# Patient Record
Sex: Male | Born: 1975 | Hispanic: Yes | Marital: Married | Smoking: Never smoker
Health system: Southern US, Community
[De-identification: ages and names within clinical notes are randomized; demographics above are authoritative.]

## PROBLEM LIST (undated history)

## (undated) DIAGNOSIS — G4730 Sleep apnea, unspecified: Secondary | ICD-10-CM

## (undated) DIAGNOSIS — K219 Gastro-esophageal reflux disease without esophagitis: Secondary | ICD-10-CM

## (undated) HISTORY — DX: Morbid (severe) obesity due to excess calories: E66.01

## (undated) HISTORY — DX: Gastro-esophageal reflux disease without esophagitis: K21.9

---
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
## 1995-09-14 HISTORY — PX: ARTHROSCOPIC REPAIR ACL: SUR80

## 1999-11-21 ENCOUNTER — Emergency Department (HOSPITAL_COMMUNITY): Admission: EM | Admit: 1999-11-21 | Discharge: 1999-11-21 | Payer: Self-pay | Admitting: Emergency Medicine

## 1999-11-21 ENCOUNTER — Encounter: Payer: Self-pay | Admitting: Emergency Medicine

## 2008-11-24 ENCOUNTER — Emergency Department (HOSPITAL_COMMUNITY): Admission: EM | Admit: 2008-11-24 | Discharge: 2008-11-25 | Payer: Self-pay | Admitting: Emergency Medicine

## 2008-11-29 ENCOUNTER — Emergency Department (HOSPITAL_COMMUNITY): Admission: EM | Admit: 2008-11-29 | Discharge: 2008-11-30 | Payer: Self-pay | Admitting: Emergency Medicine

## 2008-12-01 ENCOUNTER — Emergency Department (HOSPITAL_COMMUNITY): Admission: EM | Admit: 2008-12-01 | Discharge: 2008-12-01 | Payer: Self-pay | Admitting: Emergency Medicine

## 2009-09-22 ENCOUNTER — Encounter (INDEPENDENT_AMBULATORY_CARE_PROVIDER_SITE_OTHER): Payer: Self-pay | Admitting: *Deleted

## 2009-09-30 ENCOUNTER — Ambulatory Visit: Payer: Self-pay | Admitting: Internal Medicine

## 2009-09-30 DIAGNOSIS — M722 Plantar fascial fibromatosis: Secondary | ICD-10-CM | POA: Insufficient documentation

## 2009-10-16 ENCOUNTER — Telehealth: Payer: Self-pay | Admitting: Internal Medicine

## 2009-12-01 ENCOUNTER — Ambulatory Visit: Payer: Self-pay | Admitting: Internal Medicine

## 2009-12-01 ENCOUNTER — Encounter (INDEPENDENT_AMBULATORY_CARE_PROVIDER_SITE_OTHER): Payer: Self-pay | Admitting: *Deleted

## 2009-12-02 ENCOUNTER — Telehealth (INDEPENDENT_AMBULATORY_CARE_PROVIDER_SITE_OTHER): Payer: Self-pay | Admitting: *Deleted

## 2009-12-04 ENCOUNTER — Encounter: Payer: Self-pay | Admitting: Internal Medicine

## 2009-12-05 LAB — CONVERTED CEMR LAB
Basophils Relative: 1.6 % (ref 0.0–3.0)
Eosinophils Relative: 3 % (ref 0.0–5.0)
HCT: 42.7 % (ref 39.0–52.0)
MCV: 88 fL (ref 78.0–100.0)
Monocytes Absolute: 0.6 10*3/uL (ref 0.1–1.0)
Neutrophils Relative %: 25.2 % — ABNORMAL LOW (ref 43.0–77.0)
RBC: 4.85 M/uL (ref 4.22–5.81)
WBC: 4.3 10*3/uL — ABNORMAL LOW (ref 4.5–10.5)

## 2010-10-01 ENCOUNTER — Other Ambulatory Visit: Payer: Self-pay | Admitting: Internal Medicine

## 2010-10-01 ENCOUNTER — Ambulatory Visit
Admission: RE | Admit: 2010-10-01 | Discharge: 2010-10-01 | Payer: Self-pay | Source: Home / Self Care | Attending: Internal Medicine | Admitting: Internal Medicine

## 2010-10-01 ENCOUNTER — Encounter: Payer: Self-pay | Admitting: Internal Medicine

## 2010-10-01 LAB — HEPATIC FUNCTION PANEL
ALT: 37 U/L (ref 0–53)
AST: 21 U/L (ref 0–37)
Albumin: 4.1 g/dL (ref 3.5–5.2)
Alkaline Phosphatase: 71 U/L (ref 39–117)
Bilirubin, Direct: 0.1 mg/dL (ref 0.0–0.3)
Total Bilirubin: 0.6 mg/dL (ref 0.3–1.2)
Total Protein: 7.2 g/dL (ref 6.0–8.3)

## 2010-10-01 LAB — CBC WITH DIFFERENTIAL/PLATELET
Basophils Absolute: 0 10*3/uL (ref 0.0–0.1)
Basophils Relative: 0.3 % (ref 0.0–3.0)
Eosinophils Absolute: 0.2 10*3/uL (ref 0.0–0.7)
Eosinophils Relative: 1.6 % (ref 0.0–5.0)
HCT: 42.8 % (ref 39.0–52.0)
Hemoglobin: 14.3 g/dL (ref 13.0–17.0)
Lymphocytes Relative: 28.3 % (ref 12.0–46.0)
Lymphs Abs: 2.6 10*3/uL (ref 0.7–4.0)
MCHC: 33.3 g/dL (ref 30.0–36.0)
MCV: 85.3 fl (ref 78.0–100.0)
Monocytes Absolute: 0.6 10*3/uL (ref 0.1–1.0)
Monocytes Relative: 6.2 % (ref 3.0–12.0)
Neutro Abs: 5.8 10*3/uL (ref 1.4–7.7)
Neutrophils Relative %: 63.6 % (ref 43.0–77.0)
Platelets: 214 10*3/uL (ref 150.0–400.0)
RBC: 5.01 Mil/uL (ref 4.22–5.81)
RDW: 14.9 % — ABNORMAL HIGH (ref 11.5–14.6)
WBC: 9.2 10*3/uL (ref 4.5–10.5)

## 2010-10-01 LAB — BASIC METABOLIC PANEL
BUN: 22 mg/dL (ref 6–23)
CO2: 27 mEq/L (ref 19–32)
Calcium: 8.8 mg/dL (ref 8.4–10.5)
Chloride: 104 mEq/L (ref 96–112)
Creatinine, Ser: 1 mg/dL (ref 0.4–1.5)
GFR: 94.27 mL/min (ref >60.00)
Glucose, Bld: 85 mg/dL (ref 70–99)
Potassium: 4 mEq/L (ref 3.5–5.1)
Sodium: 138 mEq/L (ref 135–145)

## 2010-10-01 LAB — LIPID PANEL
Cholesterol: 178 mg/dL (ref 0–200)
HDL: 45.2 mg/dL (ref >39.00)
LDL Cholesterol: 116 mg/dL — ABNORMAL HIGH (ref 0–99)
Total CHOL/HDL Ratio: 4
Triglycerides: 86 mg/dL (ref 0.0–149.0)
VLDL: 17.2 mg/dL (ref 0.0–40.0)

## 2010-10-01 LAB — TSH: TSH: 1.86 u[IU]/mL (ref 0.35–5.50)

## 2010-10-13 NOTE — Consult Note (Signed)
Summary: [facility]   Imported By: Lanelle Bal 12/12/2009 10:47:27  _____________________________________________________________________  External Attachment:    Type:   Image     Comment:   External Document

## 2010-10-13 NOTE — Assessment & Plan Note (Signed)
Summary: NEW PT/BCBS/NS/KDC   Vital Signs:  Patient profile:   35 year old male Height:      69 inches Weight:      317 pounds BMI:     46.98 Temp:     98.3 degrees F oral Pulse rate:   80 / minute Resp:     16 per minute BP sitting:   122 / 86  (left arm) Cuff size:   large  Vitals Entered By: Shonna Chock (September 30, 2009 3:40 PM) CC: New Patient Establish: ongoing cough for about 1 month, nasel congestion, and  drainage down throat.  Comments No current meds   CC:  New Patient Establish: ongoing cough for about 1 month, nasel congestion, and and  drainage down throat. Marland Kitchen  History of Present Illness: Sergio Cline is here to establish as a new patient; he has several concerns. He has had a persistant NP cough since 09/06/2009 in context of throat phlegm. Nasal congestion in am with mucus & blood. Rx: Tylenol  Flu Syrup & Alka Seltzer. Also he has  non pruritic pigmentation on feet for > 25month .Sole pain upon arisng in am.  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 Eyes:  Denies blurring, double vision, and vision loss-both eyes. ENT:  Denies difficulty swallowing, ear discharge, earache, and hoarseness; No frontal headache or facial pain. CV:  Denies chest pain or discomfort, palpitations, shortness of breath with exertion, swelling of feet, and swelling of hands. Resp:  Denies  chest pain with inspiration, coughing up blood, pleuritic, shortness of breath, and wheezing; No PMH of asthma. GI:  Denies abdominal pain, bloody stools, dark tarry stools, and indigestion; Dyspepsia better off coffee. GU:  Denies discharge, dysuria, and hematuria. MS:  Denies joint redness, joint swelling, low back pain, mid back pain, and thoracic pain. Derm:  Complains of changes in color of skin; denies changes in nail beds, dryness, and hair loss. Neuro:  Complains of numbness; denies tingling; Numbness in hands after video games & with guitar. Psych:  Denies anxiety and depression. Endo:  Denies cold intolerance, excessive hunger, excessive thirst, excessive urination, and heat intolerance. Heme:  Denies abnormal bruising and bleeding. Allergy:  Denies itching eyes, seasonal allergies, and sneezing.  Physical Exam  General:  well-nourished,in no acute distress; alert,appropriate and cooperative throughout examination;overweight-appearing.   Head:  Normocephalic and atraumatic without obvious abnormalities. No apparent alopecia  Eyes:  No corneal or conjunctival inflammation noted. Perrla. Funduscopic exam benign, without hemorrhages, exudates or papilledema.  Ears:  External ear exam shows no significant lesions or deformities.  Otoscopic examination reveals clear canals, tympanic membranes are intact bilaterally without bulging, retraction, inflammation or discharge. Hearing is grossly normal bilaterally. Nose:  External nasal examination shows no deformity or inflammation. Nasal mucosa are  dry without lesions or exudates. Mouth:  Oral mucosa and oropharynx without lesions or exudates.  Teeth in good repair. Neck:  No deformities, masses, or tenderness noted. Lungs:  Normal respiratory effort, chest expands  symmetrically. Lungs are clear to auscultation, no crackles or wheezes. Heart:  Normal rate and regular rhythm. S1 and S2 normal without gallop, murmur, click, rub . Abdomen:  Bowel  sounds positive,abdomen soft and non-tender without masses, organomegaly or hernias noted. Rectal:  Deferred Genitalia:  Testes bilaterally descended without nodularity, tenderness or masses. No scrotal masses or lesions. No penis lesions or urethral discharge. Prostate:  Deferred due to age Msk:  No deformity or scoliosis noted of thoracic or lumbar spine.   Pulses:  R and L carotid,radial,dorsalis pedis and posterior tibial pulses are full and equal bilaterally Extremities:  No clubbing, cyanosis, edema, or deformity noted with normal full range of motion of all joints.  Pes planus   Neurologic:  alert & oriented X3 and DTRs symmetrical and normal.   Skin:  Minor statsis dermatitis over feet Cervical Nodes:  No lymphadenopathy noted Axillary Nodes:  No palpable lymphadenopathy Psych:  memory intact for recent and remote, normally interactive, and good eye contact.     Impression & Recommendations:  Problem # 1:  ROUTINE GENERAL MEDICAL EXAM@HEALTH  CARE FACL (ICD-V70.0)  Problem # 2:  URI (ICD-465.9)  Problem # 3:  PLANTAR FASCIITIS, BILATERAL (ICD-728.71)  Problem # 4:  STASIS DERMATITIS (ICD-454.1)  Problem # 5:  COUGH (ICD-786.2) from #2  Patient Instructions: 1)  Please schedule a follow-up appointment in 3 months. 2)  BMP prior to visit, ICD-9: 3)  Hepatic Panel prior to visit, ICD-9: 4)  Lipid Panel prior to visit, ICD-9: 5)  TSH prior to visit, ICD-9: 6)  CBC w/ Diff prior to visit, ICD-9: 7)  HbgA1C prior to visit, ICD-9: Consume  LESS THAN 40 grams of sugar/ day from LABELED foods & drinks with High Fructose Corn Syrup as #1, 2 or 33 on label. Arch supports for  shoes. Neti pot once daily until congestion has resolved . Euceri two times a day to dry nasal tissue

## 2010-10-13 NOTE — Letter (Signed)
Summary: Work Dietitian at [facility]  [address] [place], [place] [address]   Phone: [phone]  Fax: [phone]    Today's Date: December 01, 2009  Name of Patient: Sergio Cline  The above named patient had a medical visit today at:  11:45am / pm.  Please take this into consideration when reviewing the time away from work/school.    Special Instructions:  Arly.Keller ] None  [  ] To be off the remainder of today, returning to the normal work / school schedule tomorrow.  [  ] To be off until the next scheduled appointment on ______________________.  [  ] Other ________________________________________________________________ ________________________________________________________________________   Sincerely yours,   Shonna Chock

## 2010-10-13 NOTE — Letter (Signed)
Summary: New Patient Letter  [facility]  [address] [place], [place] [address]   Phone: [phone]  Fax: [phone]       09/22/2009 MRN: [id number]  Sergio Cline [address] [place], [place]  [address]  Dear [name],   Welcome to [facility] and thank you for choosing [place] as your Primary Care Providers. Enclosed you will find information about our practice that we hope you find helpful. We have also enclosed forms to be filled out prior to your visit. This will provide [place] with the necessary information and facilitate your being seen in a timely manner. If you have any questions, please call us at:  [phone]        and we will be happy to assist you. We look forward to seeing you at your scheduled appointment time.  Appointment   [name] [phone]            with Dr.  Alwyn Ren               Sincerely,  Primary Health Care Team  Please arrive 15 minutes early for your first appointment and bring your insurance card. Co-pay is required at the time of your visit.  *****Please call the office if you are not able to keep this appointment. There is a charge of $50.00 if any appointment is not cancelled or rescheduled within 24 hours.

## 2010-10-13 NOTE — Progress Notes (Signed)
Summary: yellow mucous now  Phone Note Call from Patient Call back at Home Phone [phone]   Caller: Patient Summary of Call: pt was seen on 09-30-09 for cough and nasal congestion and was told to call back if mucous changed from clear. pt states that now the nasal mucous has turn a bright yellow. pt still c/o nasal  drainage, congestion and some throat irritation. pt denies any fever, and cough. pt uses  [facility] [address].......Marland KitchenFelecia Deloach CMA  October 16, 2009 12:03 PM   Follow-up for Phone Call        Neti pot once daily as needed for congestion. Amox 500 mg three times a day #30. Follow-up by: Marga Melnick MD,  October 16, 2009 12:24 PM  Additional Follow-up for Phone Call Additional follow up Details #1::        pt aware, rx sent to pharmacy............Marland KitchenFelecia Deloach CMA  October 16, 2009 12:50 PM     New/Updated Medications: AMOXICILLIN 500 MG CAPS (AMOXICILLIN) Take three times a day Prescriptions: AMOXICILLIN 500 MG CAPS (AMOXICILLIN) Take three times a day  #30 x 0   Entered by:   Jeremy Johann CMA   Authorized by:   Marga Melnick MD   Signed by:   Jeremy Johann CMA on 10/16/2009   Method used:   Faxed to ...       [facility] [address] (retail)       [phone]       [place], [place]  [address]       Ph: [phone]       Fax: [phone]   RxID:   [id number]

## 2010-10-13 NOTE — Progress Notes (Signed)
----   Converted from flag ---- ---- 12/02/2009 9:54 AM, Harold Barban wrote: Patient called in and said the pharmacy never recieved anything about the antibiotic, just got a fax about the cream. Can you re-fax the rx for the antibiotic? Thanks! ------------------------------  Phone Note Call from Patient   Summary of Call: Rx was re-faxed./[facility]  December 02, 2009 10:05 AM     Prescriptions: AMOXICILLIN-POT CLAVULANATE 500-125 MG TABS (AMOXICILLIN-POT CLAVULANATE) 1 every 12 hrs with a meal  #20 x 0   Entered by:   Shonna Chock   Authorized by:   Marga Melnick MD   Signed by:   Shonna Chock on 12/02/2009   Method used:   Electronically to        [facility] [address] (retail)       [phone]       [place], [place]  [address]       Ph: [phone]       Fax: [phone]   RxID:   [id number]

## 2010-10-13 NOTE — Assessment & Plan Note (Signed)
Summary: infection, starting w/ingrown toenail//lch   Vital Signs:  Patient profile:   35 year old male Weight:      311.6 pounds Temp:     98.4 degrees F oral Pulse rate:   60 / minute Resp:     15 per minute BP sitting:   118 / 82  (left arm) Cuff size:   large  Vitals Entered By: Shonna Chock (December 01, 2009 11:58 AM) CC: Feet Concerns-left is the worse, ? infected Comments REVIEWED MED LIST, PATIENT AGREED DOSE AND INSTRUCTION CORRECT    CC:  Feet Concerns-left is the worse and ? infected.  History of Present Illness: Recurrent "infections" @ medial aspect of great nails over past 5 yrs.No fever , chills, sweats or pus. L great toe red since 11/28/2009 & spreading. pain with ambulation. Rx: trimming, Advil.No Diabetes  Allergies (verified): No Known Drug Allergies  Review of Systems General:  Denies chills, fever, and sweats. Derm:  Complains of changes in color of skin and poor wound healing. Endo:  Denies excessive hunger, excessive thirst, and excessive urination.  Physical Exam  General:  in no acute distress; alert,appropriate and cooperative throughout examination Pulses:  R and L dorsalis pedis and posterior tibial pulses are full and equal bilaterally Skin:  Active paronychia L great toenail medially with erythema; no purulence   Impression & Recommendations:  Problem # 1:  PARONYCHIA, LEFT GREAT TOE (ICD-681.11)  Lesser changes R great nail also The following medications were removed from the medication list:    Amoxicillin 500 Mg Caps (Amoxicillin) .Marland Kitchen... Take three times a day His updated medication list for this problem includes:    Amoxicillin-pot Clavulanate 500-125 Mg Tabs (Amoxicillin-pot clavulanate) .Marland Kitchen... 1 every 12 hrs with a meal  Orders: Podiatry Referral (Podiatry) Venipuncture (32202) TLB-CBC Platelet - w/Differential (85025-CBCD) TLB-A1C / Hgb A1C (Glycohemoglobin) (83036-A1C)  Complete Medication List: 1)  Amoxicillin-pot Clavulanate  500-125 Mg Tabs (Amoxicillin-pot clavulanate) .Marland Kitchen.. 1 every 12 hrs with a meal  Patient Instructions: 1)  Nizoral cream two times a day to nail bed & blow dry with a hairdrier Prescriptions: AMOXICILLIN-POT CLAVULANATE 500-125 MG TABS (AMOXICILLIN-POT CLAVULANATE) 1 every 12 hrs with a meal  #20 x 0   Entered and Authorized by:   Marga Melnick MD   Signed by:   Marga Melnick MD on 12/01/2009   Method used:   Faxed to ...       [facility] [address] (retail)       [phone]       [place], [place]  [address]       Ph: [phone]       Fax: [phone]   RxID:   [id number]   Appended Document: infection, starting w/ingrown toenail//lch

## 2010-10-15 NOTE — Assessment & Plan Note (Signed)
Summary: cpx & lab/cbs   Vital Signs:  Patient profile:   35 year old male Height:      68 inches Weight:      324 pounds BMI:     49.44 Temp:     97.4 degrees F oral Pulse rate:   76 / minute Resp:     14 per minute BP sitting:   122 / 80  (left arm) Cuff size:   large  Vitals Entered By: Shonna Chock CMA (October 01, 2010 9:40 AM) CC: CPX with fasting labs  Comments Patient states TDaP Up- to- date (5 years ago)   CC:  CPX with fasting labs .  History of Present Illness:    Sergio Cline is here for a physical; he wishes to pursue Lap Band Surgery . He has been working with  a Nutritionist for 8 months; he has lost up to 16# ,but he has regained it.  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 Eyes:  Denies blurring, double vision, and vision loss-both eyes. ENT:  Denies difficulty swallowing and hoarseness. CV:  Denies chest pain or discomfort, difficulty breathing at night, difficulty breathing while lying down, leg cramps with exertion, palpitations, and swelling of hands; Edema post  prolonged driving. Resp:  Complains of excessive snoring; denies cough, hypersomnolence, morning headaches, shortness of breath, sputum productive, and wheezing. GI:  Denies abdominal pain, bloody stools, constipation, dark tarry stools, diarrhea, and indigestion. GU:  Denies discharge, dysuria, and hematuria. MS:  Denies joint pain, low back pain, mid back pain, and thoracic pain; pain  in arches; seeing Dr Amanda Pea for R elbow tendosynovitis. Derm:  Denies changes in nail beds, dryness, hair loss, lesion(s), and rash. Neuro:  Denies tingling; Intermittent numbness L hand. Psych:  Denies anxiety and depression. Endo:  Denies cold intolerance, excessive hunger, excessive thirst, excessive urination, and heat intolerance. Heme:  Denies abnormal bruising and bleeding. Allergy:  Denies hives or rash and sneezing.  Physical Exam  General:  in no acute distress; alert,appropriate and cooperative throughout examination;obesity Head:  Normocephalic and atraumatic without obvious abnormalities. No apparent alopecia or balding. Eyes:  No corneal or conjunctival inflammation noted. EOMI. Perrla. Funduscopic exam benign, without hemorrhages, exudates or papilledema.No lid lag  Ears:  External ear exam shows no significant lesions or deformities.  Otoscopic examination reveals clear canals, tympanic membranes are intact bilaterally without bulging, retraction, inflammation or discharge. Hearing is grossly normal bilaterally. Nose:  External nasal examination shows no deformity or inflammation. Nasal mucosa are pink and moist without lesions or exudates. Mouth:  Oral mucosa and oropharynx without lesions or exudates.  Teeth in good repair. Oropharynx crowded Neck:  No deformities, masses, or tenderness noted. Breasts:  gynecomastia.   Lungs:  Normal respiratory effort, chest expands symmetrically. Lungs are clear to auscultation, no crackles or wheezes. Heart:  Normal rate and regular rhythm. S1 and S2 normal without gallop, murmur, click, rub .S4 Abdomen:  Bowel sounds positive,abdomen soft and non-tender without masses, organomegaly or hernias noted. Massive abdomen with panniculus Genitalia:  Testes bilaterally descended without nodularity, tenderness or masses. No scrotal masses or lesions. No penis lesions or urethral discharge. Prostate:  Habitus precludes  exam Msk:  No deformity or  scoliosis noted of thoracic or lumbar spine.   Pulses:  R and L carotid,radial,dorsalis pedis and posterior tibial pulses are full and equal bilaterally Extremities:  No clubbing, cyanosis, edema, or deformity noted with normal full range of motion of all joints.   Crepitus of knees. + Tinel's , L >R. Classic R "tennis elbow" Neurologic:  alert & oriented X3 and DTRs symmetrical and normal.   Skin:  Intact without suspicious lesions or rashes Cervical Nodes:  No lymphadenopathy noted Axillary Nodes:  No palpable lymphadenopathy Inguinal Nodes:  No significant adenopathy Psych:  memory intact for recent and remote, normally interactive, and good eye contact.     Impression & Recommendations:  Problem # 1:  ROUTINE GENERAL MEDICAL EXAM@HEALTH  CARE FACL (ICD-V70.0)  Orders: EKG w/ Interpretation ([address]) Venipuncture ([address]) TLB-Lipid Panel (80061-LIPID) TLB-BMP (Basic Metabolic Panel-BMET) (80048-METABOL) TLB-CBC Platelet - w/Differential (85025-CBCD) TLB-Hepatic/Liver Function Pnl (80076-HEPATIC) TLB-TSH (Thyroid Stimulating Hormone) (84443-TSH) Misc. Referral (Misc. Ref)  Problem # 2:  MORBID OBESITY (ICD-278.01)  Orders: Misc. Referral (Misc. Ref)  Problem # 3:  PLANTAR FASCIITIS, BILATERAL (ICD-728.71) aggravated by weight  Patient Instructions: 1)  Calculate grams of sugar / day from foods & drinks with High Fructose Corn Syrup as #2,3 or # 4 on label. Exercises  for Tennis Elbow as described   Orders Added: 1)  Est. Patient 18-39 years [99395] 2)  EKG w/ Interpretation [93000] 3)  Venipuncture [36415] 4)  TLB-Lipid Panel [80061-LIPID] 5)  TLB-BMP (Basic Metabolic Panel-BMET) [80048-METABOL] 6)  TLB-CBC Platelet - w/Differential [85025-CBCD] 7)  TLB-Hepatic/Liver Function Pnl [80076-HEPATIC] 8)  TLB-TSH (Thyroid Stimulating Hormone) [84443-TSH] 9)  Misc. Referral [Misc. Ref]   Immunization History:  Tetanus/Td Immunization History:    Tetanus/Td:  historical  (09/13/2005)   Immunization History:  Tetanus/Td Immunization History:    Tetanus/Td:  Historical (09/13/2005)    Appended Document: cpx & lab/cbs

## 2010-11-13 ENCOUNTER — Encounter: Payer: Self-pay | Admitting: Internal Medicine

## 2010-11-13 ENCOUNTER — Other Ambulatory Visit: Payer: Self-pay | Admitting: Internal Medicine

## 2010-11-13 ENCOUNTER — Ambulatory Visit (INDEPENDENT_AMBULATORY_CARE_PROVIDER_SITE_OTHER): Payer: BC Managed Care – PPO | Admitting: Internal Medicine

## 2010-11-13 DIAGNOSIS — R042 Hemoptysis: Secondary | ICD-10-CM

## 2010-11-13 DIAGNOSIS — R071 Chest pain on breathing: Secondary | ICD-10-CM | POA: Insufficient documentation

## 2010-11-13 DIAGNOSIS — J209 Acute bronchitis, unspecified: Secondary | ICD-10-CM

## 2010-11-13 LAB — CBC WITH DIFFERENTIAL/PLATELET
Basophils Relative: 0.4 % (ref 0.0–3.0)
Eosinophils Relative: 3 % (ref 0.0–5.0)
HCT: 43.2 % (ref 39.0–52.0)
Lymphs Abs: 3.3 10*3/uL (ref 0.7–4.0)
MCV: 84.1 fl (ref 78.0–100.0)
Monocytes Absolute: 0.8 10*3/uL (ref 0.1–1.0)
Neutro Abs: 6.8 10*3/uL (ref 1.4–7.7)
Platelets: 236 10*3/uL (ref 150.0–400.0)
WBC: 11.3 10*3/uL — ABNORMAL HIGH (ref 4.5–10.5)

## 2010-11-18 ENCOUNTER — Ambulatory Visit (INDEPENDENT_AMBULATORY_CARE_PROVIDER_SITE_OTHER)
Admission: RE | Admit: 2010-11-18 | Discharge: 2010-11-18 | Disposition: A | Discharge status: Home / Self Care | Payer: BC Managed Care – PPO | Source: Ambulatory Visit | Attending: Internal Medicine | Admitting: Internal Medicine

## 2010-11-18 ENCOUNTER — Other Ambulatory Visit: Payer: Self-pay | Admitting: Internal Medicine

## 2010-11-18 DIAGNOSIS — R071 Chest pain on breathing: Secondary | ICD-10-CM

## 2010-11-18 DIAGNOSIS — R042 Hemoptysis: Secondary | ICD-10-CM

## 2010-11-18 DIAGNOSIS — J209 Acute bronchitis, unspecified: Secondary | ICD-10-CM

## 2010-11-19 NOTE — Assessment & Plan Note (Signed)
Summary: cough and congestion /cdj   Vital Signs:  Patient profile:   35 year old male Weight:      321.6 pounds BMI:     49.08 O2 Sat:      93 % on Room air Temp:     98.1 degrees F oral Pulse rate:   80 / minute Resp:     15 per minute BP sitting:   118 / 88  (left arm) Cuff size:   large  Vitals Entered By: Shonna Chock CMA (November 13, 2010 11:31 AM)  O2 Flow:  Room air CC: Cough and congestion x 2 weeks. Now when patient cough's he has a stabbing pain on his right side   CC:  Cough and congestion x 2 weeks. Now when patient cough's he has a stabbing pain on his right side.  History of Present Illness:    Onset of sharp R inferior pleuritic chest pain as of last night. "Cold" 2 weeks ago was associated with cough productive of yellow sputum  with streaks of blood until 1 week ago. The streaky hemoptysis resolved with humidier use. He now  reports productive cough with green sputum  and upper airway wheezing with forced expiration  , but denies shortness of breath, fever, hemoptysis, and malaise.  The patient denies the following symptoms: cold/URI symptoms, sore throat, nasal congestion, and acid reflux symptoms.  Partially effective prior treatments have included  Hall's throat lozenges, Mucinex & Robitussin.  He denies risk factors of allergic rhinitis,  asthma, and  reflux.    Allergies (verified): No Known Drug Allergies  Physical Exam  General:  in no acute distress; alert,appropriate and cooperative throughout examination Ears:  External ear exam shows no significant lesions or deformities.  Otoscopic examination reveals clear canals, tympanic membranes are intact bilaterally without bulging, retraction, inflammation or discharge. Hearing is grossly normal bilaterally. Nose:  External nasal examination shows no deformity or inflammation. Nasal mucosa are  dry without lesions or exudates. Mouth:  Oral mucosa and oropharynx without lesions or exudates.  Teeth in good repair.  Oropharyngeal  crowding  Chest Wall:  no tenderness to palpation   Lungs:  Normal respiratory effort, chest expands symmetrically. Lungs are clear to auscultation, no crackles or wheezes. Heart:  Normal rate and regular rhythm. S1 and S2 normal without gallop, murmur, click, rub.S4 Abdomen:  Bowel sounds positive,abdomen soft and non-tender without masses, organomegaly or hernias noted. Extremities:  trace left  & R pedal edema.  Homan' s negative Cervical Nodes:  No lymphadenopathy noted Axillary Nodes:  No palpable lymphadenopathy Psych:  memory intact for recent and remote, normally interactive, and good eye contact.     Impression & Recommendations:  Problem # 1:  CHEST PAIN, PLEURITIC (ICD-786.52)  Orders: T-2 View CXR (71020TC) Venipuncture ([address]) TLB-CBC Platelet - w/Differential (85025-CBCD) T-D-Dimer Fibrin Derivatives Quantitive ([id number])  Problem # 2:  BRONCHITIS-ACUTE (ICD-466.0)  Orders: T-2 View CXR (71020TC) Venipuncture (91478) TLB-CBC Platelet - w/Differential (85025-CBCD)  His updated medication list for this problem includes:    Hydromet 5-1.5 Mg/53ml Syrp (Hydrocodone-homatropine) .Marland Kitchen... 1 tsp every 6 hrs as needed for cough    Azithromycin 250 Mg Tabs (Azithromycin) .Marland Kitchen... As per pack  Problem # 3:  HEMOPTYSIS UNSPECIFIED (ICD-786.30)  Orders: T-2 View CXR (71020TC) Venipuncture (29562) TLB-CBC Platelet - w/Differential (85025-CBCD) T-D-Dimer Fibrin Derivatives Quantitive ([id number])  Complete Medication List: 1)  Hydromet 5-1.5 Mg/42ml Syrp (Hydrocodone-homatropine) .Marland Kitchen.. 1 tsp every 6 hrs as needed for cough 2)  Azithromycin 250  Mg Tabs (Azithromycin) .... As per pack  Patient Instructions: 1)  Aleve 1-2 pills every 8 hrs as needed with food for the pleurisy. 2)  Drink as much NON dairy  fluid as you can tolerate for the next few days. Prescriptions: AZITHROMYCIN 250 MG TABS (AZITHROMYCIN) as per pack  #1 x 0   Entered and Authorized by:    Marga Melnick MD   Signed by:   Marga Melnick MD on 11/13/2010   Method used:   Print then Give to Patient   RxID:   [id number] HYDROMET 5-1.5 MG/5ML SYRP (HYDROCODONE-HOMATROPINE) 1 tsp every 6 hrs as needed for cough  #120cc x 0   Entered and Authorized by:   Marga Melnick MD   Signed by:   Marga Melnick MD on 11/13/2010   Method used:   Print then Give to Patient   RxID:   [id number]    Orders Added: 1)  T-2 View CXR [71020TC] 2)  Est. Patient Level IV [84696] 3)  Venipuncture [29528] 4)  TLB-CBC Platelet - w/Differential [85025-CBCD] 5)  T-D-Dimer Fibrin Derivatives Quantitive [[id number]]

## 2010-11-25 ENCOUNTER — Ambulatory Visit (INDEPENDENT_AMBULATORY_CARE_PROVIDER_SITE_OTHER): Payer: BC Managed Care – PPO | Admitting: Internal Medicine

## 2010-11-25 ENCOUNTER — Encounter: Payer: Self-pay | Admitting: Internal Medicine

## 2010-11-25 DIAGNOSIS — J069 Acute upper respiratory infection, unspecified: Secondary | ICD-10-CM | POA: Insufficient documentation

## 2010-11-25 DIAGNOSIS — J029 Acute pharyngitis, unspecified: Secondary | ICD-10-CM

## 2010-11-25 LAB — CONVERTED CEMR LAB: Rapid Strep: NEGATIVE

## 2010-12-01 NOTE — Assessment & Plan Note (Signed)
Summary: walkin in-sore throat-going out of country for 2 weeks/kn   Vital Signs:  Patient profile:   35 year old male Weight:      323.6 pounds BMI:     49.38 Temp:     98.5 degrees F oral Pulse rate:   84 / minute Resp:     14 per minute BP sitting:   136 / 88  (left arm) Cuff size:   large  Vitals Entered By: Shonna Chock CMA (November 25, 2010 5:05 PM) CC: Sore throat and pressure in ears (right ear is worse) , URI symptoms   CC:  Sore throat and pressure in ears (right ear is worse)  and URI symptoms.  History of Present Illness:    Onset as sneezing 11/24/2010 ; ST today. He  reports  light purulent nasal discharge and dry cough, but denies nasal congestion and earache.  The patient denies dyspnea, wheezing, vomiting, and diarrhea.  The patient also reports frontal  headache & tinnitus .  The patient denies the following risk factors for Strep sinusitis: unilateral facial pain, tooth pain, and tender adenopathy.  Rx: saline gargles, OTC drops, Theraflu. He did not take Flu shot. He basically here as leaving for [place] in am for 2 weeks.  Current Medications (verified): 1)  None  Allergies (verified): No Known Drug Allergies  Physical Exam  General:  in no acute distress; alert,appropriate and cooperative throughout examination Eyes:  No corneal or conjunctival inflammation noted. EOMI. Perrla. Vision grossly normal. Ears:  External ear exam shows no significant lesions or deformities.  Otoscopic examination reveals clear canals, tympanic membranes are intact bilaterally without bulging, retraction, inflammation or discharge. Hearing is grossly normal bilaterally. Nose:  External nasal examination shows no deformity or inflammation. Nasal mucosa are pink and moist without lesions or exudates. Mouth:  Oral mucosa and oropharynx without lesions or exudates.  Teeth in good repair. Crowding of pharynx Lungs:  Normal respiratory effort, chest expands symmetrically. Lungs are clear to  auscultation, no crackles or wheezes. Heart:  Normal rate and regular rhythm. S1 and S2 normal without gallop, murmur, click, rub or other extra sounds. Cervical Nodes:  No lymphadenopathy noted Axillary Nodes:  No palpable lymphadenopathy   Impression & Recommendations:  Problem # 1:  PHARYNGITIS-ACUTE (ICD-462)  The following medications were removed from the medication list:    Azithromycin 250 Mg Tabs (Azithromycin) .Marland Kitchen... As per pack His updated medication list for this problem includes:    Amoxicillin 500 Mg Caps (Amoxicillin) .Marland Kitchen... 1 three times a day  Problem # 2:  URI (ICD-465.9)  The following medications were removed from the medication list:    Hydromet 5-1.5 Mg/58ml Syrp (Hydrocodone-homatropine) .Marland Kitchen... 1 tsp every 6 hrs as needed for cough  Complete Medication List: 1)  Amoxicillin 500 Mg Caps (Amoxicillin) .Marland Kitchen.. 1 three times a day  Other Orders: Rapid Strep (54098)  Patient Instructions: 1)  [place] OR Zicam Melts + vitamin C 2000 mg once daily + Echinacea for 5-7 days. Take Rx if "pain , pus & fever " appear in next 5 days. 2)  Drink as much fluid as you can tolerate for the next few days. Prescriptions: AMOXICILLIN 500 MG CAPS (AMOXICILLIN) 1 three times a day  #30 x 0   Entered and Authorized by:   Marga Melnick MD   Signed by:   Marga Melnick MD on 11/25/2010   Method used:   Electronically to        [facility]  [address] (retail)       [address] [place], [place]  [address]       Ph: [phone]       Fax: [phone]   RxID:   [id number]    Orders Added: 1)  Rapid Strep [93818] 2)  Est. Patient Level III [29937]    Laboratory Results    Other Tests  Rapid Strep: negative

## 2010-12-14 ENCOUNTER — Encounter: Payer: Self-pay | Admitting: Family Medicine

## 2010-12-14 ENCOUNTER — Ambulatory Visit (INDEPENDENT_AMBULATORY_CARE_PROVIDER_SITE_OTHER): Payer: BC Managed Care – PPO | Admitting: Family Medicine

## 2010-12-14 VITALS — BP 120/78 | Temp 99.0°F | Ht 68.0 in | Wt 323.0 lb

## 2010-12-14 DIAGNOSIS — R0982 Postnasal drip: Secondary | ICD-10-CM

## 2010-12-14 MED ORDER — FLUTICASONE PROPIONATE 50 MCG/ACT NA SUSP: 2.0000 | Freq: Every day | NASAL | Status: DC

## 2010-12-14 MED ORDER — MAGIC MOUTHWASH W/LIDOCAINE: 5.0000 mL | Freq: Three times a day (TID) | ORAL | Status: DC

## 2010-12-14 NOTE — Progress Notes (Signed)
  Subjective:    Patient ID: Sergio Cline, male    DOB: October 07, 1975, 35 y.o.   MRN: [id number]  HPI Sore throat- has seen Dr Alwyn Ren 3x in 6 weeks.  Took abx 2 weeks ago.  Reports daily sore throats.  Using OTC meds w/out relief.  + PND, pain w/ swallowing.  sxs have been present for 6 weeks- alternating between sore throat and coughing.  Denies fevers.  No known sick contacts.  sxs are worse at night and first thing in the AM.  Denies hx of seasonal allergies.     Review of Systems For ROS see HPI     Objective:   Physical Exam  Constitutional: He appears well-developed and well-nourished. No distress.  HENT:  Head: Normocephalic and atraumatic.  Right Ear: Tympanic membrane normal.  Left Ear: Tympanic membrane normal.  Nose: Mucosal edema and rhinorrhea present. Right sinus exhibits no maxillary sinus tenderness and no frontal sinus tenderness. Left sinus exhibits no maxillary sinus tenderness and no frontal sinus tenderness.  Mouth/Throat: Posterior oropharyngeal erythema present. No oropharyngeal exudate or posterior oropharyngeal edema.       + PND  Neck: Normal range of motion. Neck supple.  Cardiovascular: Normal rate, regular rhythm and normal heart sounds.   Pulmonary/Chest: Effort normal and breath sounds normal. No respiratory distress. He has no wheezes.  Lymphadenopathy:    He has no cervical adenopathy.          Assessment & Plan:

## 2010-12-14 NOTE — Patient Instructions (Signed)
Your sore throat appears to be due to your post nasal drip Start the nasal spray as directed to decrease the congestion and post nasal drip Increase your fluid intake STOP taking the Aleve Start 3 ibuprofen 3x/day- take w/ food Use the Magic Mouthwash 3x/day- you can either swish and spit or swish and swallow- for pain relief Call with any questions or concerns Hang in there!

## 2010-12-15 NOTE — Assessment & Plan Note (Signed)
Seeing as how pt has been on 2 rounds of abx w/out relief and PE is not consistent w/ infxn, i do not feel he requires additional abx.  He feels that he needs 'something stronger'.  Discussed that his exam shows copious PND and that this could be the cause of his sore throat.  His 6 weeks of sxs also coincide w/ the spring bloom.  Will start nasal steroid spray- sample given.  Magic mouthwash for pain relief.  Pt strongly feels that we're 'missing something'.  Gladly offered ENT referral given his persistant sore throat- pt in agreement.

## 2010-12-22 ENCOUNTER — Encounter: Payer: Self-pay | Admitting: Internal Medicine

## 2010-12-23 ENCOUNTER — Ambulatory Visit (INDEPENDENT_AMBULATORY_CARE_PROVIDER_SITE_OTHER): Payer: BC Managed Care – PPO | Admitting: Internal Medicine

## 2010-12-23 ENCOUNTER — Ambulatory Visit: Payer: BC Managed Care – PPO | Admitting: Internal Medicine

## 2010-12-23 ENCOUNTER — Encounter: Payer: Self-pay | Admitting: Internal Medicine

## 2010-12-23 VITALS — BP 116/80 | HR 106 | Temp 98.3°F | Wt 327.8 lb

## 2010-12-23 DIAGNOSIS — J029 Acute pharyngitis, unspecified: Secondary | ICD-10-CM

## 2010-12-23 MED ORDER — METRONIDAZOLE 500 MG PO TABS: 500.0000 mg | ORAL_TABLET | Freq: Three times a day (TID) | ORAL | Status: AC

## 2010-12-23 NOTE — Patient Instructions (Signed)
Please continue the natural supplement that  you have   been using effectively. While on the antibiotic ,please  avoid alcohol intake.

## 2010-12-23 NOTE — Progress Notes (Signed)
  Subjective:    Patient ID: Sergio Cline, male    DOB: 01/02/1976, 35 y.o.   MRN: [id number]  HPI SORE THROAT   Onset: 03/16  Severity: varies  From 4 to 9 or 10 ; worse with cold air Better with: natural supplement gargle ( Malva).   He was on amoxicillin from 3/16 - 3/26 w/o resolution.  Symptoms  Fever: no    Cough/URI sxs: yes, yellow in am Myalgias: no Headache: no frontal headache  Rash: no Swollen neck glands: yes, 04/07 upon awakening    Recent Strep Exposure: no Heartburn/brash: no Allergy Sxs: no Soreness of tongue 2 weeks ago, improved also with Malva Red Flags STD exposure: no Breathing difficulty: no   Appt with ENT 04/17.   Review of Systems     Objective:   Physical Exam In no distress. Skull is normocephalic without lymphadenopathy about the head, neck, or axilla. See current vital signs Ears:  External ear exam shows no significant lesions or deformities.  Otoscopic examination reveals clear canals, tympanic membranes are intact bilaterally without bulging, retraction, inflammation or discharge.Eye - Pupils Equal Round Reactive to light, Extraocular movements intact Conjunctiva without redness or discharge.  Neck:  No deformities, thyromegaly, masses, or tenderness noted.   Supple with full range of motion without pain. Thyroid non tender w/o nodules. Oral exam: Dental hygiene is good; lips and gums are healthy appearing.There is  Bilateral  oropharyngeal erythema without  exudate  Noted No clinical thrush present. Heart:  Normal rate and regular rhythm. S1 and S2 normal without gallop, murmur, click, rub or other extra sounds.  Physiologic  S4                                                                                                   Lungs:Chest clear to auscultation; no wheezes, rhonchi,rales ,or rubs present.No increased work of breathing.  Assessment & Plan:  #1 he has recurrent pharyngitis symptoms with negative beta strep repeatedly.  Clinically  there is erythema of the oropharynx.  My concern is recurrent  Antibiotics causing  resistant organism or causing complications  such as colitis. Pending his  Evaluation  by  ENT specialist; I  recommend metronidazole 500 mg 3 times a day. He should avoid alcohol on this medicine.

## 2010-12-24 LAB — CSF CELL COUNT WITH DIFFERENTIAL
Lymphs, CSF: 83 % — ABNORMAL HIGH (ref 40–80)
Monocyte-Macrophage-Spinal Fluid: 16 % (ref 15–45)
RBC Count, CSF: 4 /mm3 — ABNORMAL HIGH
Segmented Neutrophils-CSF: 1 % (ref 0–6)
Tube #: 3
WBC, CSF: 262 /mm3 — ABNORMAL HIGH (ref 0–5)

## 2010-12-24 LAB — CBC
HCT: 40.7 % (ref 39.0–52.0)
HCT: 41.8 % (ref 39.0–52.0)
Hemoglobin: 14 g/dL (ref 13.0–17.0)
Hemoglobin: 14.5 g/dL (ref 13.0–17.0)
MCHC: 34.5 g/dL (ref 30.0–36.0)
MCHC: 34.6 g/dL (ref 30.0–36.0)
MCV: 82.9 fL (ref 78.0–100.0)
MCV: 83.2 fL (ref 78.0–100.0)
Platelets: 223 10*3/uL (ref 150–400)
RBC: 4.89 MIL/uL (ref 4.22–5.81)
RDW: 13.9 % (ref 11.5–15.5)
RDW: 14.3 % (ref 11.5–15.5)
WBC: 8.2 K/uL (ref 4.0–10.5)

## 2010-12-24 LAB — CSF CULTURE W GRAM STAIN: Culture: NO GROWTH

## 2010-12-24 LAB — DIFFERENTIAL
Basophils Absolute: 0 10*3/uL (ref 0.0–0.1)
Basophils Absolute: 0 10*3/uL (ref 0.0–0.1)
Basophils Relative: 0 % (ref 0–1)
Basophils Relative: 0 % (ref 0–1)
Eosinophils Absolute: 0.1 10*3/uL (ref 0.0–0.7)
Eosinophils Absolute: 0.1 10*3/uL (ref 0.0–0.7)
Eosinophils Relative: 1 % (ref 0–5)
Eosinophils Relative: 1 % (ref 0–5)
Lymphocytes Relative: 17 % (ref 12–46)
Lymphs Abs: 1.4 K/uL (ref 0.7–4.0)
Monocytes Absolute: 0.5 10*3/uL (ref 0.1–1.0)
Monocytes Absolute: 0.5 10*3/uL (ref 0.1–1.0)
Monocytes Relative: 7 % (ref 3–12)
Neutro Abs: 6.2 K/uL (ref 1.7–7.7)
Neutrophils Relative %: 75 % (ref 43–77)

## 2010-12-24 LAB — URINALYSIS, ROUTINE W REFLEX MICROSCOPIC
Bilirubin Urine: NEGATIVE
Glucose, UA: NEGATIVE mg/dL
Ketones, ur: 15 mg/dL — AB
Ketones, ur: NEGATIVE mg/dL
Nitrite: NEGATIVE
Nitrite: NEGATIVE
Protein, ur: NEGATIVE mg/dL
Specific Gravity, Urine: 1.024 (ref 1.005–1.030)
pH: 5.5 (ref 5.0–8.0)

## 2010-12-24 LAB — BASIC METABOLIC PANEL
BUN: 13 mg/dL (ref 6–23)
CO2: 22 mEq/L (ref 19–32)
GFR calc non Af Amer: >60 mL/min (ref >60)
Glucose, Bld: 102 mg/dL — ABNORMAL HIGH (ref 70–99)
Potassium: 4 mEq/L (ref 3.5–5.1)

## 2010-12-24 LAB — BASIC METABOLIC PANEL WITH GFR
Calcium: 9.3 mg/dL (ref 8.4–10.5)
Chloride: 101 meq/L (ref 96–112)
Creatinine, Ser: 0.97 mg/dL (ref 0.4–1.5)
GFR calc Af Amer: >60 mL/min (ref >60)
Sodium: 133 meq/L — ABNORMAL LOW (ref 135–145)

## 2010-12-24 LAB — PROTIME-INR
INR: 1 (ref 0.00–1.49)
Prothrombin Time: 13.1 seconds (ref 11.6–15.2)

## 2010-12-24 LAB — POCT I-STAT, CHEM 8
Calcium, Ion: 1.05 mmol/L — ABNORMAL LOW (ref 1.12–1.32)
HCT: 45 % (ref 39.0–52.0)
TCO2: 26 mmol/L (ref 0–100)

## 2010-12-24 LAB — PROTEIN, CSF: Total  Protein, CSF: 50 mg/dL — ABNORMAL HIGH (ref 15–45)

## 2010-12-24 LAB — GLUCOSE, CSF: Glucose, CSF: 58 mg/dL (ref 43–76)

## 2010-12-24 LAB — APTT: aPTT: 27 s (ref 24–37)

## 2011-08-17 ENCOUNTER — Encounter: Payer: Self-pay | Admitting: Internal Medicine

## 2011-08-19 ENCOUNTER — Ambulatory Visit (INDEPENDENT_AMBULATORY_CARE_PROVIDER_SITE_OTHER): Payer: BC Managed Care – PPO | Admitting: Internal Medicine

## 2011-08-19 ENCOUNTER — Encounter: Payer: Self-pay | Admitting: Internal Medicine

## 2011-08-19 VITALS — BP 118/80 | HR 99 | Temp 98.1°F | Wt 335.2 lb

## 2011-08-19 DIAGNOSIS — K219 Gastro-esophageal reflux disease without esophagitis: Secondary | ICD-10-CM

## 2011-08-19 DIAGNOSIS — R05 Cough: Secondary | ICD-10-CM

## 2011-08-19 MED ORDER — ESOMEPRAZOLE MAGNESIUM 40 MG PO CPDR: 40.0000 mg | DELAYED_RELEASE_CAPSULE | Freq: Every day | ORAL | Status: DC

## 2011-08-19 NOTE — Progress Notes (Signed)
  Subjective:    Patient ID: Sergio Cline, male    DOB: 26-Sep-1975, 35 y.o.   MRN: [id number]  HPI Cough Onset:3-4 months ago Character: Large amounts of yellow material coughed up each morning.  Occupational/environmental exposures: No but he travels extensively internationally Smoking:never ACE inhibitor:no Treatment/efficacy: He has taken antibiotics on several occasions without prolonged response. He was also seen by an otolaryngologist and direct laryngoscopy or laryngoesophagoscopy was performed. No diagnosis was made; apparently the possibility of this being related to severe reflux was raised based on changes in the "voice box". Over-the-counter reflux medicines were recommended; these have not been beneficial. Past medical history/family history pulmonary disease: no  Prior labs were reviewed. In the last 34 months his white blood count has ranged from 4300 to a high value of 11,300. Differentials have been normal except for March/2011 when he had 55% lymphocytes. Chest x-ray 11/18/10 revealed no active process.       Review of Systems Extrinsic symptoms:itchy eyes, sneezing:no  Infectious symptoms :fever, chills, sweats :no Chest symptoms:some R sided chest pain after coughing ; no hemoptysis,dyspnea or ,wheezing: GI symptoms:some dyspepsia with delayed meals; no dysphagia      Objective:   Physical Exam General appearance : good nourishment; no acute distress or increased work of breathing is present.  No  lymphadenopathy about the head, neck, or axilla noted.   Eyes: No conjunctival inflammation or lid edema is present. There is no scleral icterus.  Ears:  External ear exam shows no significant lesions or deformities.  Otoscopic examination reveals clear canals, tympanic membranes are intact bilaterally without bulging, retraction, inflammation or discharge.  Nose:  External nasal examination shows no deformity or inflammation. Nasal mucosa are pink and moist without  lesions or exudates. No septal dislocation.No obstruction to airflow.   Oral exam: Dental hygiene is good; lips and gums are healthy appearing.There is no oropharyngeal erythema or exudate noted. Crowding of oropharynx   Neck:  No deformities, thyromegaly, masses, or tenderness noted.   Supple with full range of motion without pain.   Heart:  Normal rate and regular rhythm. S1 and S2 normal without gallop, murmur, click, rub or other extra sounds.   Lungs:Chest clear to auscultation; no wheezes, rhonchi,rales ,or rubs present.No increased work of breathing.    Abdomen is soft and nontender with no organomegaly, hernias  or masses. protuberant  Extremities:  No cyanosis,  or clubbing  noted . Trace edema   Skin: Warm & dry w/o jaundice          Assessment & Plan:  #1 chronic, recurrent cough with copious sputum production, especially in the early morning. His chest x-ray has been normal; this should be repeated. Bronchiectasis is being ruled out; this is not likely. If the chest x-ray suggests recent changes, fine resolution CT scan can be performed. Pulmonary function tests may be necessary to rule out reactive airways disease as a component, but he denies asthma.  #2 probable significant esophageal reflux. A trial of Nexium 40 mg before breakfast and evening meal will be initiated over 2 weeks. Attempts will be made to obtain the actual endoscopic findings; the only report is from Dr. Pollyann Kennedy of his initial ENT visit but not the procedure.  Ultimately a combined evaluation by pulmonary and gastroenterology may be needed to define this process.

## 2011-08-19 NOTE — Patient Instructions (Addendum)
Order for x-rays entered into  the computer; these will be performed at [address] [facility] across from [facility]. No appointment is necessary. The triggers for reflux   include stress; the "aspirin family" ; alcohol; peppermint; and caffeine (coffee, tea, cola, and chocolate). The aspirin family would include aspirin and the nonsteroidal agents such as ibuprofen &  Naproxen. Tylenol would not cause reflux.Food & drink should be avoided for @ least 2 hours before going to bed.  Please take the Nexium samples one pill 30 minutes before breakfast and 30 minutes before the evening meal.

## 2011-09-09 ENCOUNTER — Ambulatory Visit (INDEPENDENT_AMBULATORY_CARE_PROVIDER_SITE_OTHER)
Admission: RE | Admit: 2011-09-09 | Discharge: 2011-09-09 | Disposition: A | Discharge status: Home / Self Care | Payer: BC Managed Care – PPO | Source: Ambulatory Visit | Attending: Internal Medicine | Admitting: Internal Medicine

## 2011-09-09 DIAGNOSIS — R05 Cough: Secondary | ICD-10-CM

## 2011-09-10 ENCOUNTER — Telehealth: Payer: Self-pay

## 2011-09-10 DIAGNOSIS — R9389 Abnormal findings on diagnostic imaging of other specified body structures: Secondary | ICD-10-CM

## 2011-09-10 NOTE — Telephone Encounter (Signed)
Message copied by Maurice Small on Fri Sep 10, 2011  5:30 PM ------      Message from: Pecola Lawless      Created: Fri Sep 10, 2011  8:02 AM       Please  blowup at least 10  balloons a day to enhance inflation of the lungs and prevent atelectasis (suboptimal inflation). Call if having fever or purulent (yellow - green) secretions. Please repeat Xray after 7 days of these inspiratory exercises. [employer]

## 2011-09-10 NOTE — Telephone Encounter (Signed)
Left message on voicemail with Dr.Hopper recommendations, future order placed for chest xray. Copy of report to be mailed

## 2011-09-27 ENCOUNTER — Ambulatory Visit (INDEPENDENT_AMBULATORY_CARE_PROVIDER_SITE_OTHER): Payer: BC Managed Care – PPO | Admitting: Internal Medicine

## 2011-09-27 ENCOUNTER — Encounter: Payer: Self-pay | Admitting: Internal Medicine

## 2011-09-27 DIAGNOSIS — R0781 Pleurodynia: Secondary | ICD-10-CM

## 2011-09-27 DIAGNOSIS — R071 Chest pain on breathing: Secondary | ICD-10-CM

## 2011-09-27 DIAGNOSIS — J9811 Atelectasis: Secondary | ICD-10-CM

## 2011-09-27 DIAGNOSIS — R05 Cough: Secondary | ICD-10-CM

## 2011-09-27 DIAGNOSIS — J9819 Other pulmonary collapse: Secondary | ICD-10-CM

## 2011-09-27 MED ORDER — TRAMADOL HCL 50 MG PO TABS: 50.0000 mg | ORAL_TABLET | Freq: Four times a day (QID) | ORAL | Status: AC | PRN

## 2011-09-27 NOTE — Patient Instructions (Addendum)
Please  blowup at least 10  balloons a day to enhance inflation of the lungs and prevent atelectasis as we discussed. Order for x-rays entered into  the computer; these will be performed at [address] [facility]. across from [facility]. No appointment is necessary.   Congratulations on starting [employer]. Your weight goal should be determined by your waist measurement. The goal ultimately is waist measurement of 40 inches or  Less.

## 2011-09-27 NOTE — Progress Notes (Signed)
  Subjective:    Patient ID: Sergio Cline, male    DOB: 04-Jul-1976, 36 y.o.   MRN: [id number]  HPI He has sharp pain in the right lateral chest with stretching or deep breathing. This has been present since November/2012. He describes some yellow sputum in the morning. He denies fever, chills, sweats, dyspnea , wheezing or hemoptysis. He has no symptoms of rhinosinusitis.  Chest x-ray 09/09/11 revealed low lung volumes. There is a suggestion of a right lower lobe opacity suggesting possible atelectasis. No effusion or pneumothorax is present. He stated he had reviewed the report but became upset and did not read the instructions concerning modified incentive spirometry using balloons.  He was traveling internationally until the end of November.  There is no personal or family history of asthma, bronchitis, emphysema, tuberculosis, or other lung disease.  He has never smoked.    Review of Systems     Objective:   Physical Exam General appearance: no acute distress or increased work of breathing is present.  No  lymphadenopathy about the head, neck, or axilla noted.   Eyes: No conjunctival inflammation or lid edema is present.   Ears:  External ear exam shows no significant lesions or deformities.  Otoscopic examination reveals clear canals, tympanic membranes are intact bilaterally without bulging, retraction, inflammation or discharge.  Nose:  External nasal examination shows no deformity or inflammation. Nasal mucosa are pink and moist without lesions or exudates. No septal dislocation or deviation.No obstruction to airflow.   Oral exam: Dental hygiene is good; lips and gums are healthy appearing.There is no oropharyngeal erythema or exudate noted.      Heart:  Normal rate and regular rhythm. S1 and S2 normal without gallop, murmur, click, rub or other extra sounds.   Lungs:Chest clear to auscultation; no wheezes, rhonchi,rales ,or rubs present.No increased work of breathing.  Specifically no rub is present with deep inspiration in the right inferior axillary line.  Extremities:  No cyanosis, edema, or clubbing  noted . Homans sign is negative. Trace ankle edema is present    Skin: Warm & dry w/o jaundice or tenting.          Assessment & Plan:   #1 pleuritic right chest pain with possible atelectasis on 12/27 chest x-ray. This was new compared to 11/18/10. The pathophysiology of atelectasis was discussed. Tylenol will be prescribed as this should not aggravate reflux.  #2 some purulent secretions each morning  #3 history of long distance air travel until the recent past  Plan: Incentive spirometry will be encouraged as noted as he has not implemented this.  Repeat chest x-ray will be  performed. If symptoms persist CT angiogram will be completed. D-dimer and CBC and differential will be checked.

## 2011-09-28 LAB — CBC WITH DIFFERENTIAL/PLATELET
Basophils Relative: 0.6 % (ref 0.0–3.0)
Eosinophils Absolute: 0.1 10*3/uL (ref 0.0–0.7)
Eosinophils Relative: 2.7 % (ref 0.0–5.0)
Hemoglobin: 15.1 g/dL (ref 13.0–17.0)
Lymphocytes Relative: 67.6 % — ABNORMAL HIGH (ref 12.0–46.0)
MCHC: 33.2 g/dL (ref 30.0–36.0)
MCV: 83.3 fl (ref 78.0–100.0)
Monocytes Absolute: 0.4 10*3/uL (ref 0.1–1.0)
Neutro Abs: 0.6 10*3/uL — ABNORMAL LOW (ref 1.4–7.7)
RBC: 5.46 Mil/uL (ref 4.22–5.81)

## 2011-09-30 ENCOUNTER — Ambulatory Visit (INDEPENDENT_AMBULATORY_CARE_PROVIDER_SITE_OTHER)
Admission: RE | Admit: 2011-09-30 | Discharge: 2011-09-30 | Disposition: A | Discharge status: Home / Self Care | Payer: BC Managed Care – PPO | Source: Ambulatory Visit | Attending: Internal Medicine | Admitting: Internal Medicine

## 2011-09-30 DIAGNOSIS — R9389 Abnormal findings on diagnostic imaging of other specified body structures: Secondary | ICD-10-CM

## 2011-09-30 DIAGNOSIS — R918 Other nonspecific abnormal finding of lung field: Secondary | ICD-10-CM

## 2011-10-15 ENCOUNTER — Ambulatory Visit (INDEPENDENT_AMBULATORY_CARE_PROVIDER_SITE_OTHER): Payer: BC Managed Care – PPO | Admitting: Internal Medicine

## 2011-10-15 ENCOUNTER — Encounter: Payer: Self-pay | Admitting: Internal Medicine

## 2011-10-15 VITALS — BP 118/74 | HR 89 | Wt 327.0 lb

## 2011-10-15 DIAGNOSIS — R071 Chest pain on breathing: Secondary | ICD-10-CM

## 2011-10-15 DIAGNOSIS — R0781 Pleurodynia: Secondary | ICD-10-CM

## 2011-10-15 DIAGNOSIS — Z01818 Encounter for other preprocedural examination: Secondary | ICD-10-CM

## 2011-10-15 LAB — BUN: BUN: 17 mg/dL (ref 6–23)

## 2011-10-15 NOTE — Patient Instructions (Signed)
Order for CAT scan entered into  the computer; these will be performed at [facility].

## 2011-10-15 NOTE — Progress Notes (Signed)
  Subjective:    Patient ID: Sergio Cline, male    DOB: 1976-07-08, 36 y.o.   MRN: [id number]  HPI   The right inferior thoracic pain has not changed. It is exacerbated by deep breathing, range of motion of the right upper extremity, and ambulation.  He denies any significant arthralgias or rash. He's had no calf pain and swelling. He also denies any recurrent hemoptysis.  His chest x-ray, d-dimer, and CBC and differential were normal  One grandmother was a smoker and had lung cancer. The other apparently had metastatic lesions to the lung from an unknown primary. There is no family history or personal history of asthma, chronic bronchitis, emphysema, or tuberculosis.    Review of Systems The excess mucous production in the morning has improved with the prescribed maneuvers and change in sleeping position. He has lost an inch and a half inches @ the waist & 7 pounds with Weight [employer].     Objective:   Physical Exam He is in no distress  & has  increased work of breathing   Skin is clear with no lesions or rashes  There is no lymphadenopathy about the neck or axilla.  He has a soft S4 but no significant murmurs or extra heart sounds.  Chest is clear without rhonchi, wheezes, rales, or rubs.  There is no tenderness to compression of the chest or percussion over the right inferior thorax  There is no tenderness to palpation right upper quadrant  Homans sign is negative.  Musculoskeletal exam was unremarkable.             Assessment & Plan:   #1 pleuritic chest pain, persistent. No dermatologic or rheumatologic findings.  Routine chest x-ray and d-dimer normal.  Plan: CT scan with and without contrast will be performed to assess his persistent symptoms. Pulmonary thromboemboli would be unlikely in view of a persistent localized nature of his complaints and the negative d-dimer.  Sedimentation rate and ANA will be collected.

## 2011-10-16 LAB — SEDIMENTATION RATE: Sed Rate: 16 mm/hr (ref 0–16)

## 2011-10-20 ENCOUNTER — Other Ambulatory Visit (HOSPITAL_BASED_OUTPATIENT_CLINIC_OR_DEPARTMENT_OTHER): Payer: BC Managed Care – PPO

## 2011-10-21 ENCOUNTER — Ambulatory Visit (HOSPITAL_BASED_OUTPATIENT_CLINIC_OR_DEPARTMENT_OTHER)
Admission: RE | Admit: 2011-10-21 | Discharge: 2011-10-21 | Disposition: A | Discharge status: Home / Self Care | Payer: BC Managed Care – PPO | Source: Ambulatory Visit | Attending: Internal Medicine | Admitting: Internal Medicine

## 2011-10-21 DIAGNOSIS — R071 Chest pain on breathing: Secondary | ICD-10-CM

## 2011-10-21 DIAGNOSIS — R0781 Pleurodynia: Secondary | ICD-10-CM

## 2011-10-21 DIAGNOSIS — R0789 Other chest pain: Secondary | ICD-10-CM | POA: Insufficient documentation

## 2011-10-21 MED ORDER — IOHEXOL 350 MG/ML SOLN
80.0000 mL | Freq: Once | INTRAVENOUS | Status: AC | PRN
  Administered 2011-10-21: 80 mL via INTRAVENOUS

## 2012-01-20 ENCOUNTER — Ambulatory Visit (INDEPENDENT_AMBULATORY_CARE_PROVIDER_SITE_OTHER): Payer: BC Managed Care – PPO | Admitting: Family Medicine

## 2012-01-20 ENCOUNTER — Encounter: Payer: Self-pay | Admitting: Family Medicine

## 2012-01-20 VITALS — BP 130/78 | HR 100 | Temp 98.6°F | Ht 68.0 in | Wt 337.8 lb

## 2012-01-20 DIAGNOSIS — J329 Chronic sinusitis, unspecified: Secondary | ICD-10-CM

## 2012-01-20 MED ORDER — AMOXICILLIN 875 MG PO TABS: 875.0000 mg | ORAL_TABLET | Freq: Two times a day (BID) | ORAL | Status: AC

## 2012-01-20 NOTE — Assessment & Plan Note (Signed)
New.  Pt's sxs and PE consistent w/ infxn.  Start abx.  Reviewed supportive care and red flags that should prompt return.  Pt expressed understanding and is in agreement w/ plan.  

## 2012-01-20 NOTE — Progress Notes (Signed)
  Subjective:    Patient ID: Sergio Cline, male    DOB: 26-Mar-1976, 36 y.o.   MRN: [id number]  HPI URI- sxs started on Sunday.  Took OTC meds w/out relief.  + nasal congestion, particularly at night.  + PND and subsequent cough.  No fevers.  No ear pain.  + maxillary pain w/ tooth pain.  + sick contacts.   Review of Systems For ROS see HPI     Objective:   Physical Exam  Vitals reviewed. Constitutional: He appears well-developed and well-nourished. No distress.  HENT:  Head: Normocephalic and atraumatic.  Right Ear: Tympanic membrane normal.  Left Ear: Tympanic membrane normal.  Nose: Mucosal edema and rhinorrhea present. Right sinus exhibits maxillary sinus tenderness. Right sinus exhibits no frontal sinus tenderness. Left sinus exhibits maxillary sinus tenderness. Left sinus exhibits no frontal sinus tenderness.  Mouth/Throat: Mucous membranes are normal. Oropharyngeal exudate and posterior oropharyngeal erythema present. No posterior oropharyngeal edema.       + PND  Eyes: Conjunctivae and EOM are normal. Pupils are equal, round, and reactive to light.  Neck: Normal range of motion. Neck supple.  Cardiovascular: Normal rate, regular rhythm and normal heart sounds.   Pulmonary/Chest: Effort normal and breath sounds normal. No respiratory distress. He has no wheezes.       + hacking cough  Lymphadenopathy:    He has no cervical adenopathy.  Skin: Skin is warm and dry.          Assessment & Plan:

## 2012-01-20 NOTE — Patient Instructions (Signed)
This is a sinus infection Start the Amoxicillin twice daily w/ food Add daily Zyrtec Drink plenty of fluids REST! Have a great trip!

## 2012-05-12 ENCOUNTER — Encounter: Payer: Self-pay | Admitting: Family Medicine

## 2012-05-12 ENCOUNTER — Ambulatory Visit (INDEPENDENT_AMBULATORY_CARE_PROVIDER_SITE_OTHER): Payer: BC Managed Care – PPO | Admitting: Family Medicine

## 2012-05-12 VITALS — BP 126/80 | HR 99 | Temp 98.3°F | Ht 69.0 in | Wt 345.5 lb

## 2012-05-12 DIAGNOSIS — A09 Infectious gastroenteritis and colitis, unspecified: Secondary | ICD-10-CM

## 2012-05-12 DIAGNOSIS — T169XXA Foreign body in ear, unspecified ear, initial encounter: Secondary | ICD-10-CM | POA: Insufficient documentation

## 2012-05-12 DIAGNOSIS — J309 Allergic rhinitis, unspecified: Secondary | ICD-10-CM | POA: Insufficient documentation

## 2012-05-12 MED ORDER — CIPROFLOXACIN HCL 500 MG PO TABS: 500.0000 mg | ORAL_TABLET | Freq: Two times a day (BID) | ORAL | Status: DC

## 2012-05-12 MED ORDER — CIPROFLOXACIN HCL 500 MG PO TABS: 500.0000 mg | ORAL_TABLET | Freq: Two times a day (BID) | ORAL | Status: AC

## 2012-05-12 NOTE — Assessment & Plan Note (Signed)
New.  Start Cipro given recent travel to [place].  No abdominal TTP to suggest intraabdominal process.  Reviewed supportive care and red flags that should prompt return.  Pt expressed understanding and is in agreement w/ plan.

## 2012-05-12 NOTE — Assessment & Plan Note (Signed)
New.  This is cause of pt's sore throat due to PND.  Start nasal steroid (sample given) and daily antihistamine.  Reviewed supportive care and red flags that should prompt return.  Pt expressed understanding and is in agreement w/ plan.

## 2012-05-12 NOTE — Patient Instructions (Signed)
We'll notify you of your ENT appt Start the Nasonex- 2 sprays each nostril daily to decrease post nasal drip Start Claritin or Zyrtec daily for the nasal allergies Take the Cipro twice daily for travellers diarrhea Drink plenty of fluids Call with any questions or concerns Hang in there!!

## 2012-05-12 NOTE — Progress Notes (Signed)
  Subjective:    Patient ID: Sergio Cline, male    DOB: [birth year], 36 y.o.   MRN: [id number]  HPI Diarrhea- returned from vacation in [place] last week but 3 days later developed same sxs as entire travel party.  + sore throat, head pressure, cough.  Took theraflu w/ good relief but continues to have sore throat in the AM.  sxs x10 days.  + PND.  + nasal congestion.  Diarrhea x1 week.  Stools are now loose but were previously watery.  No blood.  Having 5-6 episodes daily.  No nausea or vomiting.  No fevers.  No changes to diet, no new or different foods.    No one else in travel party w/ diarrhea.   Review of Systems For ROS see HPI     Objective:   Physical Exam  Vitals reviewed. Constitutional: He appears well-developed and well-nourished. No distress.  HENT:  Head: Normocephalic and atraumatic.  Right Ear: Tympanic membrane normal. There is tenderness. A foreign body (dark area on canal, TTP, unable to move w/ qtip) is present.  Left Ear: Tympanic membrane and ear canal normal.       No TTP over sinuses + turbinate edema + PND TMs normal bilaterally  Eyes: Conjunctivae and EOM are normal. Pupils are equal, round, and reactive to light.  Neck: Normal range of motion. Neck supple.  Cardiovascular: Normal rate, regular rhythm and normal heart sounds.   Pulmonary/Chest: Effort normal and breath sounds normal. No respiratory distress. He has no wheezes.  Abdominal: Soft. Bowel sounds are normal. He exhibits no distension. There is no tenderness. There is no rebound.  Lymphadenopathy:    He has no cervical adenopathy.  Skin: Skin is warm and dry.          Assessment & Plan:

## 2012-05-12 NOTE — Assessment & Plan Note (Signed)
New.  Unclear if this is blood blister vs melanoma vs insect but it is attached, tender to palpation w/ qtip and immobile.  Will refer to ENT today for evaluation as pt just returned from [place].

## 2012-10-20 ENCOUNTER — Ambulatory Visit (INDEPENDENT_AMBULATORY_CARE_PROVIDER_SITE_OTHER): Payer: BC Managed Care – PPO | Admitting: Family Medicine

## 2012-10-20 ENCOUNTER — Encounter: Payer: Self-pay | Admitting: Family Medicine

## 2012-10-20 VITALS — BP 120/80 | HR 86 | Temp 97.7°F | Ht 68.5 in | Wt 350.4 lb

## 2012-10-20 DIAGNOSIS — J309 Allergic rhinitis, unspecified: Secondary | ICD-10-CM

## 2012-10-20 DIAGNOSIS — J069 Acute upper respiratory infection, unspecified: Secondary | ICD-10-CM

## 2012-10-20 MED ORDER — GUAIFENESIN-CODEINE 100-10 MG/5ML PO SYRP: 10.0000 mL | ORAL_SOLUTION | Freq: Three times a day (TID) | ORAL | Status: DC | PRN

## 2012-10-20 MED ORDER — FLUTICASONE PROPIONATE 50 MCG/ACT NA SUSP: 2.0000 | Freq: Every day | NASAL | Status: DC

## 2012-10-20 NOTE — Progress Notes (Signed)
  Subjective:    Patient ID: Sergio Cline, male    DOB: [birth year], 37 y.o.   MRN: [id number]  HPI URI- developed hoarseness on Sunday, started OTC meds.  Now w/ sinus pressure, HA, sore throat.  Cough- productive.  No fevers.  Using mucinex w/ good results.  No ear pain.  No N/V/D.  + sick contacts.  + nasal congestion and PND.   Review of Systems For ROS see HPI     Objective:   Physical Exam  Vitals reviewed. Constitutional: He appears well-developed and well-nourished. No distress.  HENT:  Head: Normocephalic and atraumatic.       No TTP over sinuses + turbinate edema + PND TMs normal bilaterally  Eyes: Conjunctivae normal and EOM are normal. Pupils are equal, round, and reactive to light.  Neck: Normal range of motion. Neck supple.  Cardiovascular: Normal rate, regular rhythm and normal heart sounds.   Pulmonary/Chest: Effort normal and breath sounds normal. No respiratory distress. He has no wheezes.  Lymphadenopathy:    He has no cervical adenopathy.  Skin: Skin is warm and dry.          Assessment & Plan:

## 2012-10-20 NOTE — Assessment & Plan Note (Signed)
Deteriorated.  Re-start nasal steroid.  Add OTC anti-histamine.  Reviewed supportive care and red flags that should prompt return.  Pt expressed understanding and is in agreement w/ plan.

## 2012-10-20 NOTE — Assessment & Plan Note (Signed)
No evidence of bacterial infxn.  No need for abx.  Cough syrup prn.  Sudafed for congestion.  Reviewed supportive care and red flags that should prompt return.  Pt expressed understanding and is in agreement w/ plan.

## 2012-10-20 NOTE — Patient Instructions (Addendum)
This appears to be a viral/allergy combo Start Claritin or Zyrtec daily Use the nasal spray- 2 sprays each nostril daily Use the cough syrup for nights and weekend- will make you drowsy Use Mucinex DM for daytime cough 3-4 days of OTC sudafed should improve your nasal congestion Drink plenty of fluids REST! Hang in there!

## 2012-12-21 ENCOUNTER — Encounter: Payer: Self-pay | Admitting: Internal Medicine

## 2012-12-21 ENCOUNTER — Ambulatory Visit (INDEPENDENT_AMBULATORY_CARE_PROVIDER_SITE_OTHER): Payer: BC Managed Care – PPO | Admitting: Internal Medicine

## 2012-12-21 VITALS — BP 126/88 | HR 88 | Temp 97.9°F | Wt 347.0 lb

## 2012-12-21 DIAGNOSIS — R071 Chest pain on breathing: Secondary | ICD-10-CM

## 2012-12-21 DIAGNOSIS — J029 Acute pharyngitis, unspecified: Secondary | ICD-10-CM

## 2012-12-21 LAB — POCT RAPID STREP A (OFFICE): Rapid Strep A Screen: NEGATIVE

## 2012-12-21 MED ORDER — AMOXICILLIN 500 MG PO CAPS: 500.0000 mg | ORAL_CAPSULE | Freq: Three times a day (TID) | ORAL | Status: DC

## 2012-12-21 NOTE — Patient Instructions (Addendum)
Zicam Melts or Zinc lozenges as per package label for sore throat . Complementary options include  vitamin C 2000 mg daily; & Echinacea for 4-7 days. Report persistent or progressive fever.

## 2012-12-21 NOTE — Progress Notes (Signed)
  Subjective:    Patient ID: Sergio Cline, male    DOB: 1976/07/13, 37 y.o.   MRN: [id number]  HPI  Symptoms began 12/17/12 and sore throat which improved during the day but was worse at night. This was associated with malaise and yellow secretions when he would clear his throat. He also had nasal purulence. He had associated frontal sinus and facial pain 4/8-4/9. He may have had some fever; he noted intermittent hot flashes.  His cough is mainly in the morning; there was no associated shortness of breath or wheezing.  TheraFlu was of some benefit as he was able to rest better at night    Review of Systems  He denies dental pain or otic pain. He's had some increase in cerumen in his ears.  There were no associated extrinsic symptoms of itchy, watery eyes or sneezing.  He has had significant extrinsic issues with cough related to cat dander.     Objective:   Physical Exam General appearance:well nourished; no acute distress or increased work of breathing is present.  No  lymphadenopathy about the head, neck, or axilla noted.   Eyes: No conjunctival inflammation or lid edema is present.  Ears:  External ear exam shows no significant lesions or deformities.  Otoscopic examination reveals clear canals, tympanic membranes are intact bilaterally without bulging, retraction, inflammation or discharge.  Nose:  External nasal examination shows no deformity or inflammation. Nasal mucosa are pink and moist without lesions or exudates. No septal dislocation or deviation.No obstruction to airflow.   Oral exam: Dental hygiene is good; lips and gums are healthy appearing.There is marked oropharyngeal erythema. No exudate noted.   Neck:  No deformities,  masses, or tenderness noted.   Supple with full range of motion without pain.   Heart:  Normal rate and regular rhythm. S1 and S2 normal without gallop, murmur, click, rub . S 4   Lungs:Chest clear to auscultation; no wheezes, rhonchi,rales ,or  rubs present.No increased work of breathing.    Extremities:  No cyanosis, edema, or clubbing  noted    Skin: Damp w/o jaundice          Assessment & Plan:  #1 upper respiratory infection with pharyngitis  Plan: See orders and recommendations

## 2013-06-20 ENCOUNTER — Ambulatory Visit (INDEPENDENT_AMBULATORY_CARE_PROVIDER_SITE_OTHER): Payer: BC Managed Care – PPO | Admitting: Internal Medicine

## 2013-06-20 ENCOUNTER — Encounter: Payer: Self-pay | Admitting: Internal Medicine

## 2013-06-20 VITALS — BP 118/82 | HR 79 | Temp 97.8°F | Wt 363.0 lb

## 2013-06-20 DIAGNOSIS — J069 Acute upper respiratory infection, unspecified: Secondary | ICD-10-CM

## 2013-06-20 DIAGNOSIS — J029 Acute pharyngitis, unspecified: Secondary | ICD-10-CM

## 2013-06-20 MED ORDER — AMOXICILLIN 500 MG PO CAPS: 1000.0000 mg | ORAL_CAPSULE | Freq: Two times a day (BID) | ORAL | Status: DC

## 2013-06-20 NOTE — Progress Notes (Signed)
  Subjective:    Patient ID: Sergio Cline, male    DOB: 1975-09-23, 37 y.o.   MRN: [id number]  HPI Acute visit,Here with his wife. Has been sick for 2 days -- frontal headache, throat congestion, cough with green/brown sputum, few drops of blood. Wife is sick with similar symptoms, she just came back from [place].   Has been taking a number of OTCs including Advil, TheraFlu, Mucinex.  Past Medical History  Diagnosis Date  . Morbid obesity    Past Surgical History  Procedure Laterality Date  . Appendectomy  1990  . Arthroscopic repair acl  1997    left knee   History   Social History  . Marital Status: Married    Spouse Name: N/A    Number of Children: 0  . Years of Education: N/A   Occupational History  . [employer]    Social History Main Topics  . Smoking status: Never Smoker   . Smokeless tobacco: Never Used  . Alcohol Use: Yes     Comment: 2/week  . Drug Use: No  . Sexual Activity: Not on file   Other Topics Concern  . Not on file   Social History Narrative   From [place]    Review of Systems No fever or chills No nausea, vomiting, diarrhea. No myalgias or arthralgias No rash, no sore throat per se.    Objective:   Physical Exam BP 118/82  Pulse 79  Temp(Src) 97.8 F (36.6 C)  Wt 363 lb (164.656 kg)  BMI 54.38 kg/m2  SpO2 100% General -- alert, well-developed, NAD.   HEENT-- Not pale. TMs normal, throat symmetric, no redness or discharge. Face symmetric, sinuses not tender to palpation. Nose quite congested. Lungs -- normal respiratory effort, no intercostal retractions, no accessory muscle use, and normal breath sounds.  Heart-- normal rate, regular rhythm, no murmur.   Extremities-- no pretibial edema bilaterally  Neurologic--  alert & oriented X3. Speech normal, gait normal, strength normal in all extremities.   Psych-- Cognition and judgment appear intact. Cooperative with normal attention span and concentration. No anxious appearing , no  depressed appearing.      Assessment & Plan:  URI, Patient presents with symptoms consistent with a URI, Wife is also seen today with more severe and  generalized symptoms consistent with viral syndrome; in her particular case, she just came from [place] and we are currently in a global alert d/t Ebola. Given Ebola's period of incubation, is very unlikely Meric has such condition. Strep A (-) Plan: We'll cover with  Amoxicillin (?early sinusitis), if not better in few days he will need further eval such as chest x-ray and labs. See instructions

## 2013-06-20 NOTE — Patient Instructions (Signed)
Rest, fluids , tylenol or motrin if fever or pain For cough, take Mucinex DM or Theraflu  as needed , follow the package instructions  Take the antibiotic as prescribed  (Amoxicillin) Call if no better in few days Call anytime if the symptoms are severe, you have high fever, short of breath, chest pain

## 2013-06-21 ENCOUNTER — Encounter: Payer: Self-pay | Admitting: Internal Medicine

## 2013-06-22 ENCOUNTER — Telehealth: Payer: Self-pay | Admitting: *Deleted

## 2013-06-22 NOTE — Telephone Encounter (Signed)
Dr Janifer Adie to assess

## 2013-06-22 NOTE — Telephone Encounter (Signed)
Patient called and stated that he is still feeling sick and didn't go to work today. He would like to know if he can have a work note for today. Please advise.

## 2013-09-20 ENCOUNTER — Other Ambulatory Visit (INDEPENDENT_AMBULATORY_CARE_PROVIDER_SITE_OTHER): Payer: Self-pay

## 2013-09-20 ENCOUNTER — Encounter: Payer: Self-pay | Admitting: Internal Medicine

## 2013-09-20 ENCOUNTER — Ambulatory Visit (INDEPENDENT_AMBULATORY_CARE_PROVIDER_SITE_OTHER): Payer: BC Managed Care – PPO | Admitting: Surgery

## 2013-09-20 ENCOUNTER — Encounter (INDEPENDENT_AMBULATORY_CARE_PROVIDER_SITE_OTHER): Payer: Self-pay | Admitting: Surgery

## 2013-09-20 ENCOUNTER — Ambulatory Visit (INDEPENDENT_AMBULATORY_CARE_PROVIDER_SITE_OTHER): Payer: BC Managed Care – PPO | Admitting: Internal Medicine

## 2013-09-20 ENCOUNTER — Encounter (INDEPENDENT_AMBULATORY_CARE_PROVIDER_SITE_OTHER): Payer: Self-pay

## 2013-09-20 VITALS — BP 142/84 | HR 78 | Temp 97.8°F | Resp 18 | Ht 68.0 in | Wt 362.8 lb

## 2013-09-20 VITALS — BP 108/72 | HR 105 | Temp 98.5°F | Wt 366.4 lb

## 2013-09-20 DIAGNOSIS — R202 Paresthesia of skin: Secondary | ICD-10-CM

## 2013-09-20 DIAGNOSIS — G4733 Obstructive sleep apnea (adult) (pediatric): Secondary | ICD-10-CM | POA: Insufficient documentation

## 2013-09-20 DIAGNOSIS — K219 Gastro-esophageal reflux disease without esophagitis: Secondary | ICD-10-CM

## 2013-09-20 DIAGNOSIS — E7800 Pure hypercholesterolemia, unspecified: Secondary | ICD-10-CM

## 2013-09-20 DIAGNOSIS — R209 Unspecified disturbances of skin sensation: Secondary | ICD-10-CM

## 2013-09-20 NOTE — Assessment & Plan Note (Signed)
Nexium trial Antireflux protocol

## 2013-09-20 NOTE — Assessment & Plan Note (Signed)
   Definitive study pending

## 2013-09-20 NOTE — Progress Notes (Signed)
Pre visit review using our clinic review tool, if applicable. No additional management support is needed unless otherwise documented below in the visit note. 

## 2013-09-20 NOTE — Assessment & Plan Note (Signed)
Fasting labs as requested by the Bariatric Clinic

## 2013-09-20 NOTE — Progress Notes (Signed)
   Subjective:    Patient ID: Sergio Cline, male    DOB: 01/05/1976, 38 y.o.   MRN: [id number]  HPI   He is here for evaluation for possible bariatric surgery participation. His body mass index is 55; he meets the criteria of morbid obesity.  He also has co morbidities of GERD, dyslipidemia, and possible sleep apnea. The latter is to be evaluated  In the last several weeks he's noted numbness of both hands; he has no history of hypothyroidism in the past or carpal tunnel syndrome.  His reflexes flared recently in the context of taking 2 aspirin a day for the numbness of the hands and drinking 2 cups of coffee daily.  He denies intake of alcohol or smoking  His partner is treated with Alka-Seltzer chewies.  Review of Systems He denies dysphagia, unexplained weight loss, abdominal pain, melena, rectal bleeding, or small caliber stools.     Objective:   Physical Exam  General appearance : morbidly obese w/o distress.  Eyes: No conjunctival inflammation or scleral icterus is present.No lid lag or proptosis.  Oral exam: Dental hygiene is good; lips and gums are healthy appearing.There is no oropharyngeal erythema or exudate noted.   Heart:  Slightly fast  rate and regular rhythm. S1 and S2 normal without gallop, murmur, click,or rub. S4 with slurring at  LSB   Lungs:Chest clear to auscultation; no wheezes, rhonchi,rales ,or rubs present.No increased work of breathing.   Abdomen: bowel sounds normal, soft and non-tender without masses, organomegaly or hernias noted.  No guarding or rebound . Massive panniculus  Musculoskeletal: Able to lie flat and sit up without help. Negative straight leg raising bilaterally. Gait normal  Skin:Warm & dry.  Intact without suspicious lesions or rashes ; no jaundice or tenting  Lymphatic: No lymphadenopathy is noted about the head, neck, axilla.  Trace ankle edema  Deep tendon reflexes normal and equal. Negative Tinel sign  bilaterally               Assessment & Plan:  See Current Assessment & Plan in Problem List under specific Diagnosis

## 2013-09-20 NOTE — Patient Instructions (Signed)
Gastric Bypass Surgery Care After Refer to this sheet in the next few weeks. These discharge instructions provide you with general information on caring for yourself after you leave the hospital. Your caregiver may also give you specific instructions. Your treatment has been planned according to the most current medical practices available, but unavoidable complications sometimes occur. If you have any problems or questions after discharge, call your caregiver. HOME CARE INSTRUCTIONS  Activity  Take frequent walks throughout the day. This will help to prevent blood clots. Do not sit for longer than 45 minutes to 1 hour while awake for 4 to 6 weeks after surgery.  Continue to do coughing and deep breathing exercises once you get home. This will help to prevent pneumonia.  Do not do strenuous activities, such as heavy lifting, pushing, or pulling, until after your follow-up visit with your caregiver. Do not lift anything heavier than 10 lb (4.5 kg).  Talk with your caregiver about when you may return to work and your exercise routine.  Do not drive while taking prescription pain medicine. Nutrition  It is very important that you drink at least 80 oz (2,400 mL) of fluid a day.  You should stay on a liquid diet until your follow-up visit with your caregiver. Keep sugar-free, liquid items on hand, including:  Tea: hot or cold. Drink only decaffeinated for the first month.  Broths: beef, chicken, vegetable.  Others: water, sugar-free frozen ice pops, flavored water, gelatin (after 1 week).  Do not consume caffeine for 1 month. Large amounts of caffeine can cause dehydration.  A dietician may also give you specific instructions.  Follow your caregiver's recommendations about vitamins and protein requirements after surgery. Hygiene  You may shower and wash your hair 2 days after surgery. Pat incisions dry. Do not rub incisions with a washcloth or towel.  Follow your caregiver's  recommendations about baths and pools following surgery. Pain control  If a prescription medicine was given, follow your caregiver's directions.  You may feel some gas pain caused by the carbon dioxide used to inflate your abdomen during surgery. This pain can be felt in your chest, shoulder, back, or abdominal area. Moving around often is advised. Incision care  You may have 4 or more small incisions. They are closed with skin adhesive strips. Skin adhesive strips can get wet and will fall off on their own. Check your incisions and surrounding area daily for any redness, swelling, discoloration, fluid (drainage), or bleeding. Dark red, dried blood may appear under these coverings. This is normal.  If you have a drain, it will be removed at your follow-up visit or before you leave the hospital.  If your drain is left in, follow your caregiver's instructions on drain care.  If your drain is taken out, keep a clean, dry bandage over the drain site. SEEK MEDICAL CARE IF:   You develop persistent nausea and vomiting.  You have pain and discomfort with swallowing.  You have pain, swelling, or warmth in the lower extremities.  You have an oral temperature above 102 F (38.9 C).  You develop chills.  Your incision sites look red, swollen, or have drainage.  Your stool is black, tarry, or maroon in color.  You are lightheaded when standing.  You notice a bruise getting larger.  You have any questions or concerns. SEEK IMMEDIATE MEDICAL CARE IF:   You have chest pain.  You have severe calf pain or pain not relieved by medicine.  You develop shortness of   breath or difficulty breathing.  There is bright red blood coming from the drain.  You feel confused.  You have slurred speech.  You suddenly feel weak. MAKE SURE YOU:   Understand these instructions.  Will watch your condition.  Will get help right away if you are not doing well or get worse. Document Released:  04/13/2004 Document Revised: 12/25/2012 Document Reviewed: 01/20/2010 ExitCare Patient Information 2014 [employer].  

## 2013-09-20 NOTE — Assessment & Plan Note (Signed)
Fasting lipids

## 2013-09-20 NOTE — Assessment & Plan Note (Signed)
Thyroid function test  Wrist splints may be necessary while sleeping

## 2013-09-20 NOTE — Progress Notes (Signed)
Chief Complaint:  Morbid obesity with a BMI of 55-referred by Dr. Alwyn Ren for bariatric surgery  History of Present Illness:  Sergio Cline is an 38 y.o. male originally from [place] who comes in today with his wife to discuss bariatric surgery options. He initially indicated an interest in the LAP-BAND has been doing more research and is interested in having a Roux-en-Y gastric bypass. I went over the procedure with him in some detail outlining the risk including bleeding and anastomotic leaks. He indicates that he feels these probably prediabetic and that this would be a better operation for him since he travels a lot and band fills would be difficult.  They asked me questions have been the research.  He does admit to snoring and Cline off to sleep easily during the day. His wife says that after he eats a meal he will oftentimes is full sleep. He snores loudly and clinically I think he has obstructive sleep apnea. We will go ahead and see about studying him for that as he might benefit from a CPAP machine. He also indicates he has significant GERD.  Past Medical History  Diagnosis Date  . Morbid obesity   . GERD (gastroesophageal reflux disease)     Past Surgical History  Procedure Laterality Date  . Appendectomy  1990  . Arthroscopic repair acl  1997    left knee    No current outpatient prescriptions on file.   No current facility-administered medications for this visit.   Review of patient's allergies indicates no known allergies. Family History  Problem Relation Age of Onset  . Cancer Maternal Grandmother     ? primary , lung metastases  . Lung cancer Maternal Grandfather     smoker   Social History:   reports that he has never smoked. He has never used smokeless tobacco. He reports that he drinks alcohol. He reports that he does not use illicit drugs.   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 He is otherwise been healthy. He has had an appendectomy. There is some positivity for some numbness in his hands these noticed recently.  Physical Exam:   Blood pressure 142/84, pulse 78, temperature 97.8 F (36.6 C), resp. rate 18, height 5\' 8"  (1.727 m), weight 362 lb 12.8 oz (164.565 kg). Body mass index is 55.18 kg/(m^2).  Gen:  WDWN WM NAD  Neurological: Alert and oriented to person, place, and time. Motor and sensory function is grossly intact  Head: Normocephalic and atraumatic.  Eyes: Conjunctivae are normal. Pupils are equal, round, and reactive to light. No scleral icterus.  Neck: Normal range of motion. Neck supple. No tracheal deviation or thyromegaly present.  Cardiovascular:  SR without murmurs or gallops.  No carotid bruits Respiratory: Effort normal.  No respiratory distress. No chest wall tenderness. Breath sounds normal.  No wheezes, rales or rhonchi.  Abdomen:  Obese.  Prior appendectomy GU: Musculoskeletal: Normal range of motion. Extremities are nontender. No cyanosis, edema or clubbing noted Lymphadenopathy: No cervical, preauricular, postauricular or axillary adenopathy is present Skin: Skin is warm and dry. No rash noted. No diaphoresis. No erythema. No pallor. Pscyh: Normal mood and affect. Behavior is normal. Judgment and thought content normal.   LABORATORY RESULTS: No results found for this or any previous visit (from the past 48 hour(s)).  RADIOLOGY RESULTS: No results found.  Problem List: Patient  Active Problem List   Diagnosis Date Noted  . OSA (obstructive sleep apnea) 09/20/2013  . Allergic rhinitis 05/12/2012  . HEMOPTYSIS UNSPECIFIED 11/13/2010  . MORBID OBESITY 10/01/2010  . PLANTAR FASCIITIS, BILATERAL 09/30/2009    Assessment & Plan: Morbid obesity with probable obstructive sleep apnea and GERD. Will move forward and workup for Roux-en-Y gastric bypass.  Workup to include study for obstructive sleep apnea.    Matt B. Daphine DeutscherMartin, MD, [facility] Center[facility]  [facility] [phone] beeper [phone]  09/20/2013 11:24 AM

## 2013-09-20 NOTE — Patient Instructions (Signed)
Reflux of gastric acid may be asymptomatic as this may occur mainly during sleep.The triggers for reflux  include stress; the "aspirin family" ; alcohol; peppermint; and caffeine (coffee, tea, cola, and chocolate). The aspirin family would include aspirin and the nonsteroidal agents such as ibuprofen &  Naproxen. Tylenol would not cause reflux. If having symptoms ; food & drink should be avoided for @ least 2 hours before going to bed. Protein pump inhibitors such as omeprazole or Nexium each  before meal is an option.

## 2013-09-21 ENCOUNTER — Other Ambulatory Visit (INDEPENDENT_AMBULATORY_CARE_PROVIDER_SITE_OTHER): Payer: BC Managed Care – PPO

## 2013-09-21 ENCOUNTER — Encounter: Payer: Self-pay | Admitting: *Deleted

## 2013-09-21 LAB — CBC WITH DIFFERENTIAL/PLATELET
Basophils Absolute: 0 10*3/uL (ref 0.0–0.1)
Basophils Relative: 0.3 % (ref 0.0–3.0)
EOS PCT: 2.8 % (ref 0.0–5.0)
Eosinophils Absolute: 0.3 10*3/uL (ref 0.0–0.7)
HCT: 42.3 % (ref 39.0–52.0)
Hemoglobin: 13.8 g/dL (ref 13.0–17.0)
Lymphocytes Relative: 27 % (ref 12.0–46.0)
Lymphs Abs: 2.5 10*3/uL (ref 0.7–4.0)
MCHC: 32.7 g/dL (ref 30.0–36.0)
MCV: 81.2 fl (ref 78.0–100.0)
MONO ABS: 0.5 10*3/uL (ref 0.1–1.0)
MONOS PCT: 4.9 % (ref 3.0–12.0)
NEUTROS PCT: 65 % (ref 43.0–77.0)
Neutro Abs: 6.1 10*3/uL (ref 1.4–7.7)
PLATELETS: 226 10*3/uL (ref 150.0–400.0)
RBC: 5.21 Mil/uL (ref 4.22–5.81)
RDW: 15.5 % — ABNORMAL HIGH (ref 11.5–14.6)
WBC: 9.4 10*3/uL (ref 4.5–10.5)

## 2013-09-21 LAB — COMPREHENSIVE METABOLIC PANEL
ALBUMIN: 3.7 g/dL (ref 3.5–5.2)
ALK PHOS: 77 U/L (ref 39–117)
ALT: 35 U/L (ref 0–53)
AST: 21 U/L (ref 0–37)
BUN: 17 mg/dL (ref 6–23)
CO2: 28 mEq/L (ref 19–32)
Calcium: 8.7 mg/dL (ref 8.4–10.5)
Chloride: 107 mEq/L (ref 96–112)
Creatinine, Ser: 1 mg/dL (ref 0.4–1.5)
GFR: 89.52 mL/min (ref >60.00)
GLUCOSE: 101 mg/dL — AB (ref 70–99)
POTASSIUM: 3.9 meq/L (ref 3.5–5.1)
SODIUM: 141 meq/L (ref 135–145)
TOTAL PROTEIN: 7 g/dL (ref 6.0–8.3)
Total Bilirubin: 0.5 mg/dL (ref 0.3–1.2)

## 2013-09-21 LAB — T4, FREE: FREE T4: 0.98 ng/dL (ref 0.60–1.60)

## 2013-09-21 LAB — HEMOGLOBIN A1C: HEMOGLOBIN A1C: 6.3 % (ref 4.6–6.5)

## 2013-09-21 LAB — TSH: TSH: 2.76 u[IU]/mL (ref 0.35–5.50)

## 2013-10-04 ENCOUNTER — Encounter: Payer: Self-pay | Admitting: *Deleted

## 2013-10-05 ENCOUNTER — Telehealth: Payer: Self-pay | Admitting: *Deleted

## 2013-10-05 NOTE — Telephone Encounter (Signed)
Letter of medial necessity completed and faxed to [facility] office.

## 2013-10-10 ENCOUNTER — Encounter (HOSPITAL_COMMUNITY)
Admission: RE | Disposition: A | Discharge status: Home / Self Care | Payer: BC Managed Care – PPO | Source: Ambulatory Visit | Attending: Surgery

## 2013-10-10 ENCOUNTER — Ambulatory Visit (HOSPITAL_COMMUNITY)
Admission: RE | Admit: 2013-10-10 | Discharge: 2013-10-10 | Disposition: A | Discharge status: Home / Self Care | Payer: BC Managed Care – PPO | Source: Ambulatory Visit | Attending: Surgery | Admitting: Surgery

## 2013-10-10 ENCOUNTER — Other Ambulatory Visit: Payer: Self-pay

## 2013-10-10 DIAGNOSIS — G4733 Obstructive sleep apnea (adult) (pediatric): Secondary | ICD-10-CM | POA: Insufficient documentation

## 2013-10-10 DIAGNOSIS — Z6841 Body Mass Index (BMI) 40.0 and over, adult: Secondary | ICD-10-CM | POA: Insufficient documentation

## 2013-10-10 DIAGNOSIS — M722 Plantar fascial fibromatosis: Secondary | ICD-10-CM | POA: Insufficient documentation

## 2013-10-10 DIAGNOSIS — R042 Hemoptysis: Secondary | ICD-10-CM | POA: Insufficient documentation

## 2013-10-10 DIAGNOSIS — K219 Gastro-esophageal reflux disease without esophagitis: Secondary | ICD-10-CM | POA: Insufficient documentation

## 2013-10-10 HISTORY — PX: BREATH TEK H PYLORI: SHX5422

## 2013-10-10 SURGERY — BREATH TEST, FOR HELICOBACTER PYLORI

## 2013-10-10 NOTE — Progress Notes (Signed)
10/10/13 [id number]  BREATH TEK ASSESSMENT  Referring MD Luretha MurphyMatthew Martin  Time of Last PO Intake 2300 (on 10/09/2013)  Baseline Breath At: 0754  Pranactin Given At: 0757  Post-Dose Breath At: 0813  Sample 1 1.9  Sample 2 2.3  Test Negative  Comments sent to Dr. Daphine DeutscherMartin in [facility]

## 2013-10-11 ENCOUNTER — Encounter (HOSPITAL_COMMUNITY): Payer: Self-pay | Admitting: Surgery

## 2013-10-11 LAB — COMPREHENSIVE METABOLIC PANEL
ALT: 28 U/L (ref 0–53)
AST: 17 U/L (ref 0–37)
Albumin: 4.2 g/dL (ref 3.5–5.2)
Alkaline Phosphatase: 76 U/L (ref 39–117)
BILIRUBIN TOTAL: 0.5 mg/dL (ref 0.2–1.2)
BUN: 21 mg/dL (ref 6–23)
CO2: 28 meq/L (ref 19–32)
CREATININE: 0.9 mg/dL (ref 0.50–1.35)
Calcium: 9.2 mg/dL (ref 8.4–10.5)
Chloride: 104 mEq/L (ref 96–112)
GLUCOSE: 82 mg/dL (ref 70–99)
Potassium: 4.2 mEq/L (ref 3.5–5.3)
Sodium: 140 mEq/L (ref 135–145)
Total Protein: 6.9 g/dL (ref 6.0–8.3)

## 2013-10-11 LAB — HEMOGLOBIN A1C
HEMOGLOBIN A1C: 6.1 % — AB (ref <5.7)
MEAN PLASMA GLUCOSE: 128 mg/dL — AB (ref <117)

## 2013-10-11 LAB — CBC WITH DIFFERENTIAL/PLATELET
Basophils Absolute: 0 10*3/uL (ref 0.0–0.1)
Basophils Relative: 0 % (ref 0–1)
EOS ABS: 0.2 10*3/uL (ref 0.0–0.7)
EOS PCT: 3 % (ref 0–5)
HCT: 45.2 % (ref 39.0–52.0)
HEMOGLOBIN: 14.3 g/dL (ref 13.0–17.0)
LYMPHS ABS: 2.9 10*3/uL (ref 0.7–4.0)
Lymphocytes Relative: 30 % (ref 12–46)
MCH: 26.5 pg (ref 26.0–34.0)
MCHC: 31.6 g/dL (ref 30.0–36.0)
MCV: 83.7 fL (ref 78.0–100.0)
MONOS PCT: 5 % (ref 3–12)
Monocytes Absolute: 0.5 10*3/uL (ref 0.1–1.0)
Neutro Abs: 6 10*3/uL (ref 1.7–7.7)
Neutrophils Relative %: 62 % (ref 43–77)
Platelets: 236 10*3/uL (ref 150–400)
RBC: 5.4 MIL/uL (ref 4.22–5.81)
RDW: 15.3 % (ref 11.5–15.5)
WBC: 9.7 10*3/uL (ref 4.0–10.5)

## 2013-10-11 LAB — TSH: TSH: 3.022 u[IU]/mL (ref 0.350–4.500)

## 2013-10-11 LAB — T4: T4 TOTAL: 11 ug/dL (ref 5.0–12.5)

## 2013-10-20 ENCOUNTER — Encounter: Payer: Self-pay | Admitting: Dietician

## 2013-10-20 ENCOUNTER — Encounter: Payer: BC Managed Care – PPO | Attending: Surgery | Admitting: Dietician

## 2013-10-20 DIAGNOSIS — Z01818 Encounter for other preprocedural examination: Secondary | ICD-10-CM | POA: Insufficient documentation

## 2013-10-20 DIAGNOSIS — Z713 Dietary counseling and surveillance: Secondary | ICD-10-CM | POA: Insufficient documentation

## 2013-10-20 NOTE — Patient Instructions (Signed)
Follow Pre-Op goals. Try protein shakes. Contact [facility] when surgery is scheduled to enroll in Pre-Op class.  Take 20 minutes to eat meals - chew really thoroughly.

## 2013-10-20 NOTE — Progress Notes (Signed)
  Pre-Op Assessment Visit:  Pre-Operative RYGB Surgery  Medical Nutrition Therapy:  Appt start time: 1600   End time:  1640.  Patient was seen on 10/20/2013 for Pre-Operative RYGB Nutrition Assessment. [facility] on 10/20/2013.   Handouts given during visit include:  Pre-Op Goals Bariatric Surgery Protein Shakes Pre-Op Diet  Patient to call the [facility] to enroll in Pre-Op and Post-Op Nutrition Education when surgery date is scheduled.

## 2013-10-26 ENCOUNTER — Ambulatory Visit (HOSPITAL_BASED_OUTPATIENT_CLINIC_OR_DEPARTMENT_OTHER): Payer: BC Managed Care – PPO | Attending: Surgery

## 2013-10-26 VITALS — Ht 68.0 in | Wt 362.0 lb

## 2013-10-26 DIAGNOSIS — G4733 Obstructive sleep apnea (adult) (pediatric): Secondary | ICD-10-CM | POA: Insufficient documentation

## 2013-10-26 DIAGNOSIS — Z9989 Dependence on other enabling machines and devices: Secondary | ICD-10-CM

## 2013-10-27 DIAGNOSIS — G4733 Obstructive sleep apnea (adult) (pediatric): Secondary | ICD-10-CM

## 2013-10-27 NOTE — Sleep Study (Signed)
   NAME: Sergio Cline DATE OF BIRTH:  10/09/1975 MEDICAL RECORD NUMBER [id number]  LOCATION: [facility]  PHYSICIAN: Elpidio Thielen D  DATE OF STUDY: 10/26/2013  SLEEP STUDY TYPE: Nocturnal Polysomnogram               REFERRING PHYSICIAN: Valarie MerinoMartin, Matthew B, MD  INDICATION FOR STUDY: Hypersomnia with sleep apnea  EPWORTH SLEEPINESS SCORE:   22/24 HEIGHT: 5\' 8"  (172.7 cm)  WEIGHT: 362 lb (164.202 kg)    Body mass index is 55.05 kg/(m^2).  NECK SIZE: 18 in.  MEDICATIONS: Charted for review  SLEEP ARCHITECTURE: Split study protocol. During the diagnostic phase, total sleep time 123.5 minutes with sleep efficiency 92.2%. Stage I was 2.4%, stage II 89.1%, stage III absent, REM 8.5% of total sleep time. Sleep latency 5 minutes, REM latency 79 minutes, awake after sleep onset 5.5 minutes, arousal index 1.5. Bedtime medication: None  RESPIRATORY DATA: Apnea hypopnea index (AHI) 151.6 per hour. 312 events were scored including 211 obstructive apneas, 1 central apnea, 3 mixed apneas, 97 hypopneas. Events were not positional. REM AHI 102.9 per hour. CPAP was titrated to 17 CWP with inadequate control. He was then titrated with bilevel to a final inspiratory pressure of 25 CWP and expiratory pressure of 21 CWP still leaving residual AHI 15 per hour. He wore a [employer] Simplus fullface mask, size small, with heated humidifier.  OXYGEN DATA: Moderate snoring before CPAP with oxygen desaturation to a nadir of 61% on room air. At final bilevel pressures, snoring was almost absent with mean oxygen saturation 89% on room air.  CARDIAC DATA: Sinus rhythm with occasional PVCs  MOVEMENT/PARASOMNIA: No significant movement disturbance, bathroom x1  IMPRESSION/ RECOMMENDATION:   1) Very severe obstructive sleep apnea/hypopneas syndrome, AHI 151.6 per hour with non-positional events. Moderate snoring with oxygen desaturation to a nadir of 61% on room air. 2) CPAP could not control the  events at 17 CWP and was converted to bilevel (BiPAP) with final titration to inspiratory pressure 25 and expiratory pressure 21 within the study time available. There were residual events, but snoring was markedly improved. Mean oxygen saturation at final settings 89% on room air. He wore a small [employer] Simplus mask with heated humidifier. Weight loss is highly likely to reduce the pressure setting required.  Signed Jetty Duhamellinton Ferlin Fairhurst M.D. Waymon BudgeYOUNG,Maitland Lesiak D Diplomate, American Board of Sleep Medicine  ELECTRONICALLY SIGNED ON:  10/27/2013, 12:54 PM [facility] PH: [phone]   FX: [phone] ACCREDITED BY THE AMERICAN ACADEMY OF SLEEP MEDICINE

## 2013-11-30 ENCOUNTER — Other Ambulatory Visit (INDEPENDENT_AMBULATORY_CARE_PROVIDER_SITE_OTHER): Payer: Self-pay | Admitting: Surgery

## 2013-12-24 ENCOUNTER — Encounter: Payer: BC Managed Care – PPO | Attending: Surgery

## 2013-12-24 DIAGNOSIS — Z01818 Encounter for other preprocedural examination: Secondary | ICD-10-CM | POA: Insufficient documentation

## 2013-12-24 DIAGNOSIS — Z713 Dietary counseling and surveillance: Secondary | ICD-10-CM | POA: Insufficient documentation

## 2013-12-26 ENCOUNTER — Institutional Professional Consult (permissible substitution): Payer: BC Managed Care – PPO | Admitting: Pulmonary Disease

## 2013-12-26 NOTE — Progress Notes (Signed)
  Pre-Operative Nutrition Class:  Appt start time: 1470   End time:  1830.  Patient was seen on 12/24/13 for Pre-Operative Bariatric Surgery Education at [facility].   Surgery date: 01/14/2014 Surgery type: RYGB Start weight at [facility]: 357 on 10/20/13 Weight today: 357.5 lbs  TANITA  BODY COMP RESULTS  12/24/13   BMI (kg/m^2) 54.4   Fat Mass (lbs) 158.5   Fat Free Mass (lbs) 199   Total Body Water (lbs) 145.5   Samples given per MNT protocol. Patient educated on appropriate usage: Bariactiv Multivitamin (Qty 1) Lot #: [id number] Exp: 01/2015  Bariatric Advantage Calcium Citrate (cherry - Qty 1) Lot #: [id number] Exp: 06/2014  Premier protein shake (chocolate - Qty 1) Lot #: [id number] Exp: 09/2014  Renee Pain Protein Powder (unflavored - Qty 1) Lot #: [id number] Exp: 12/2014    The following the learning objectives were met by the patient during this course:  Identify Pre-Op Dietary Goals and will begin 2 weeks pre-operatively  Identify appropriate sources of fluids and proteins   State protein recommendations and appropriate sources pre and post-operatively  Identify Post-Operative Dietary Goals and will follow for 2 weeks post-operatively  Identify appropriate multivitamin and calcium sources  Describe the need for physical activity post-operatively and will follow MD recommendations  State when to call healthcare provider regarding medication questions or post-operative complications  Handouts given during class include:  Pre-Op Bariatric Surgery Diet Handout  Protein Shake Handout  Post-Op Bariatric Surgery Nutrition Handout  BELT Program Information Flyer  Support Group Information Flyer  [facility] Bariatric Supplements Price List  Follow-Up Plan: Patient will follow-up at [facility] 2 weeks post operatively for diet advancement per MD.

## 2013-12-27 ENCOUNTER — Ambulatory Visit (INDEPENDENT_AMBULATORY_CARE_PROVIDER_SITE_OTHER): Payer: BC Managed Care – PPO | Admitting: Pulmonary Disease

## 2013-12-27 ENCOUNTER — Encounter (HOSPITAL_COMMUNITY): Payer: Self-pay | Admitting: Pharmacy Technician

## 2013-12-27 ENCOUNTER — Encounter: Payer: Self-pay | Admitting: Pulmonary Disease

## 2013-12-27 VITALS — BP 130/88 | HR 88 | Ht 68.0 in | Wt 361.0 lb

## 2013-12-27 DIAGNOSIS — G4733 Obstructive sleep apnea (adult) (pediatric): Secondary | ICD-10-CM

## 2013-12-27 NOTE — Progress Notes (Deleted)
   Subjective:    Patient ID: Sergio Cline, male    DOB: 09/13/1975, 38 y.o.   MRN: [id number]  HPI    Review of Systems  Constitutional: Negative for fever, chills, diaphoresis, activity change, appetite change, fatigue and unexpected weight change.  HENT: Positive for congestion, postnasal drip, rhinorrhea and sneezing. Negative for dental problem, ear discharge, ear pain, facial swelling, hearing loss, mouth sores, nosebleeds, sinus pressure, sore throat, tinnitus, trouble swallowing and voice change.   Eyes: Negative for photophobia, discharge, itching and visual disturbance.  Respiratory: Negative for apnea, cough, choking, chest tightness, shortness of breath, wheezing and stridor.   Cardiovascular: Negative for chest pain, palpitations and leg swelling.  Gastrointestinal: Negative for nausea, vomiting, abdominal pain, constipation, blood in stool and abdominal distention.  Genitourinary: Negative for dysuria, urgency, frequency, hematuria, flank pain, decreased urine volume and difficulty urinating.  Musculoskeletal: Negative for arthralgias, back pain, gait problem, joint swelling, myalgias, neck pain and neck stiffness.  Skin: Negative for color change, pallor and rash.  Neurological: Negative for dizziness, tremors, seizures, syncope, speech difficulty, weakness, light-headedness, numbness and headaches.  Hematological: Negative for adenopathy. Does not bruise/bleed easily.  Psychiatric/Behavioral: Negative for confusion, sleep disturbance and agitation. The patient is not nervous/anxious.        Objective:   Physical Exam        Assessment & Plan:

## 2013-12-27 NOTE — Progress Notes (Signed)
Chief Complaint  Patient presents with  . Sleep Consult    referred by Dr. Daphine DeutscherMartin. Epworth Score: 14.    History of Present Illness: Sergio Cline is a 38 y.o. male for evaluation of sleep problems.  He is followed by Dr. Daphine DeutscherMartin and bariatric surgery center.  He is schedule for bariatric surgery in May 2015.  During evaluation for surgery he had sleep study.  This study showed very severe sleep apnea.  He has not been set up on therapy yet.  He snores, and is very sleepy during the day.  He falls asleep when sitting on his couch.  He will get congested in the morning.  He also talks in his sleep.  He goes to sleep at 11 pm.  He falls asleep quickly.  He sleeps through the night.  He gets out of bed at 515 am.  He feels tire in the morning.  He denies morning headache.  He does not use anything to help him fall sleep or stay awake.  He denies sleep walking, sleep talking, bruxism, or nightmares.  There is no history of restless legs.  He denies sleep hallucinations, sleep paralysis, or cataplexy.  The Epworth score is 14 out of 24.  Tests: PSG 10/26/13 >> AHI 151.6, SpO2 low 61%.  BiPAP 25/21 cm H2O >> AHI 15.  Sergio Cline  has a past medical history of Morbid obesity and GERD (gastroesophageal reflux disease).  Sergio Cline  has past surgical history that includes Appendectomy (1990); Arthroscopic repair ACL (1997); and Breath tek h pylori (N/A, 10/10/2013).  Prior to Admission medications   Not on File    No Known Allergies  His family history includes Cancer in his maternal grandmother; Lung cancer in his maternal grandfather. There is no history of Diabetes, Stroke, or Heart disease.  He  reports that he has never smoked. He has never used smokeless tobacco. He reports that he does not drink alcohol or use illicit drugs.  Review of Systems  Constitutional: Negative for fever, chills, diaphoresis, activity change, appetite change, fatigue and unexpected weight  change.  HENT: Positive for congestion, postnasal drip, rhinorrhea and sneezing. Negative for dental problem, ear discharge, ear pain, facial swelling, hearing loss, mouth sores, nosebleeds, sinus pressure, sore throat, tinnitus, trouble swallowing and voice change.   Eyes: Negative for photophobia, discharge, itching and visual disturbance.  Respiratory: Negative for apnea, cough, choking, chest tightness, shortness of breath, wheezing and stridor.   Cardiovascular: Negative for chest pain, palpitations and leg swelling.  Gastrointestinal: Negative for nausea, vomiting, abdominal pain, constipation, blood in stool and abdominal distention.  Genitourinary: Negative for dysuria, urgency, frequency, hematuria, flank pain, decreased urine volume and difficulty urinating.  Musculoskeletal: Negative for arthralgias, back pain, gait problem, joint swelling, myalgias, neck pain and neck stiffness.  Skin: Negative for color change, pallor and rash.  Neurological: Negative for dizziness, tremors, seizures, syncope, speech difficulty, weakness, light-headedness, numbness and headaches.  Hematological: Negative for adenopathy. Does not bruise/bleed easily.  Psychiatric/Behavioral: Negative for confusion, sleep disturbance and agitation. The patient is not nervous/anxious.    Physical Exam:  General - No distress ENT - No sinus tenderness, no oral exudate, no LAN, no thyromegaly, TM clear, pupils equal/reactive, MP 4 Cardiac - s1s2 regular, no murmur, pulses symmetric Chest - No wheeze/rales/dullness, good air entry, normal respiratory excursion Back - No focal tenderness Abd - Soft, non-tender, no organomegaly, + bowel sounds Ext - No edema Neuro - Normal strength, cranial nerves intact  Skin - No rashes Psych - Normal mood, and behavior  Assessment/plan:  Chesley Mires, M.D. Pager [phone]

## 2013-12-27 NOTE — Assessment & Plan Note (Signed)
He has very severe obstructive sleep apnea.    I have reviewed the recent sleep study results with the patient.  We discussed how sleep apnea can affect various health problems including risks for hypertension, cardiovascular disease, and diabetes.  We also discussed how sleep disruption can increase risks for accident, such as while driving.  Weight loss as a means of improving sleep apnea was also reviewed.  Additional treatment options discussed were CPAP therapy, oral appliance, and surgical intervention.  He was not able to be controlled with CPAP during titration portion of the study.  He required BiPAP to improve his apnea, but still had increase in his AHI.  I suspect his PAP needs will improve after he has surgery and losses weight.    Will arrange for auto BiPAP set up.  Will re-assess his needs in 3 months after he has recovered from surgery, and hopefully started to lose weight.

## 2013-12-27 NOTE — Progress Notes (Signed)
Sleep study 10-26-13 epic ekg 10-10-13 epic Chest 2 view xray  10-10-13 epic

## 2013-12-27 NOTE — Patient Instructions (Signed)
Will arrange for BiPAP set up Follow up in 3 months

## 2013-12-27 NOTE — Patient Instructions (Addendum)
[address] GamesMarcelo D Prom  12/27/2013   Your procedure is scheduled on: Monday May 4th, 2015  Report to [facility] at  515 AM.  Call this number if you have problems the morning of surgery [phone]   Remember: BRING BIPAP MASK AND TUBING, FOLLOW ANY BOWEL PREP INSTRUCTIONS FROM DR Daphine DeutscherMARTIN  Do not eat food or drink liquids :After Midnight.     Take these medicines the morning of surgery with A SIP OF WATER: no meds to take                               You may not have any metal on your body including hair pins and piercings  Do not wear jewelry, make-up, lotions, powders, or deodorant.   Men may shave face and neck.  Do not bring valuables to the hospital. [facility] IS NOT RESPONSIBLE FOR VALUABLES.  Contacts, dentures or bridgework may not be worn into surgery.  Leave suitcase in the car. After surgery it may be brought to your room.  For patients admitted to the hospital, checkout time is 11:00 AM the day of discharge.   Patients discharged the day of surgery will not be allowed to drive home.  Name and phone number of your driver:  Special Instructions: N/A  [place] - Preparing for Surgery Before surgery, you can play an important role.  Because skin is not sterile, your skin needs to be as free of germs as possible.  You can reduce the number of germs on your skin by washing with CHG (chlorahexidine gluconate) soap before surgery.  CHG is an antiseptic cleaner which kills germs and bonds with the skin to continue killing germs even after washing. Please DO NOT use if you have an allergy to CHG or antibacterial soaps.  If your skin becomes reddened/irritated stop using the CHG and inform your nurse when you arrive at Short Stay. Do not shave (including legs and underarms) for at least 48 hours prior to the first CHG shower.  You may shave your face. Please follow these instructions carefully:  1.  Shower with CHG Soap the night before surgery and the  morning  of Surgery.  2.  If you choose to wash your hair, wash your hair first as usual with your  normal  shampoo.  3.  After you shampoo, rinse your hair and body thoroughly to remove the  shampoo.                           4.  Use CHG as you would any other liquid soap.  You can apply chg directly  to the skin and wash                       Gently with a scrungie or clean washcloth.  5.  Apply the CHG Soap to your body ONLY FROM THE NECK DOWN.   Do not use on open                           Wound or open sores. Avoid contact with eyes, ears mouth and genitals (private parts).                        Genitals (private parts) with your normal soap.  6.  Wash thoroughly, paying special attention to the area where your surgery  will be performed.  7.  Thoroughly rinse your body with warm water from the neck down.  8.  DO NOT shower/wash with your normal soap after using and rinsing off  the CHG Soap.                9.  Pat yourself dry with a clean towel.            10.  Wear clean pajamas.            11.  Place clean sheets on your bed the night of your first shower and do not  sleep with pets. Day of Surgery : Do not apply any lotions/deodorants the morning of surgery.  Please wear clean clothes to the hospital/surgery center.  FAILURE TO FOLLOW THESE INSTRUCTIONS MAY RESULT IN THE CANCELLATION OF YOUR SURGERY PATIENT SIGNATURE_________________________________  NURSE SIGNATURE__________________________________

## 2013-12-28 ENCOUNTER — Encounter (HOSPITAL_COMMUNITY)
Admission: RE | Admit: 2013-12-28 | Discharge: 2013-12-28 | Disposition: A | Discharge status: Home / Self Care | Payer: BC Managed Care – PPO | Source: Ambulatory Visit | Attending: Surgery | Admitting: Surgery

## 2013-12-28 ENCOUNTER — Encounter (HOSPITAL_COMMUNITY): Payer: Self-pay

## 2013-12-28 DIAGNOSIS — Z01812 Encounter for preprocedural laboratory examination: Secondary | ICD-10-CM | POA: Insufficient documentation

## 2013-12-28 HISTORY — DX: Sleep apnea, unspecified: G47.30

## 2013-12-28 LAB — CBC WITH DIFFERENTIAL/PLATELET
BASOS ABS: 0 10*3/uL (ref 0.0–0.1)
Basophils Relative: 0 % (ref 0–1)
EOS PCT: 2 % (ref 0–5)
Eosinophils Absolute: 0.2 10*3/uL (ref 0.0–0.7)
HEMATOCRIT: 44.7 % (ref 39.0–52.0)
HEMOGLOBIN: 14 g/dL (ref 13.0–17.0)
LYMPHS PCT: 28 % (ref 12–46)
Lymphs Abs: 2.8 10*3/uL (ref 0.7–4.0)
MCH: 26.3 pg (ref 26.0–34.0)
MCHC: 31.3 g/dL (ref 30.0–36.0)
MCV: 84 fL (ref 78.0–100.0)
MONO ABS: 0.6 10*3/uL (ref 0.1–1.0)
MONOS PCT: 6 % (ref 3–12)
Neutro Abs: 6.3 10*3/uL (ref 1.7–7.7)
Neutrophils Relative %: 63 % (ref 43–77)
Platelets: 219 10*3/uL (ref 150–400)
RBC: 5.32 MIL/uL (ref 4.22–5.81)
RDW: 14.8 % (ref 11.5–15.5)
WBC: 10 10*3/uL (ref 4.0–10.5)

## 2013-12-28 LAB — COMPREHENSIVE METABOLIC PANEL
ALT: 29 U/L (ref 0–53)
AST: 20 U/L (ref 0–37)
Albumin: 3.7 g/dL (ref 3.5–5.2)
Alkaline Phosphatase: 86 U/L (ref 39–117)
BILIRUBIN TOTAL: 0.3 mg/dL (ref 0.3–1.2)
BUN: 21 mg/dL (ref 6–23)
CALCIUM: 9.2 mg/dL (ref 8.4–10.5)
CHLORIDE: 102 meq/L (ref 96–112)
CO2: 27 mEq/L (ref 19–32)
CREATININE: 0.93 mg/dL (ref 0.50–1.35)
GFR calc Af Amer: >90 mL/min (ref >90)
Glucose, Bld: 86 mg/dL (ref 70–99)
Potassium: 4.6 mEq/L (ref 3.7–5.3)
Sodium: 141 mEq/L (ref 137–147)
Total Protein: 7.2 g/dL (ref 6.0–8.3)

## 2013-12-28 NOTE — Progress Notes (Signed)
barimas bed with trapeze ordered from [facility]

## 2013-12-31 ENCOUNTER — Encounter (INDEPENDENT_AMBULATORY_CARE_PROVIDER_SITE_OTHER): Payer: Self-pay

## 2013-12-31 ENCOUNTER — Telehealth (INDEPENDENT_AMBULATORY_CARE_PROVIDER_SITE_OTHER): Payer: Self-pay

## 2013-12-31 NOTE — Telephone Encounter (Signed)
RTW note mailed to patient

## 2014-01-03 ENCOUNTER — Other Ambulatory Visit (HOSPITAL_COMMUNITY): Payer: Self-pay | Admitting: Orthopedic Surgery

## 2014-01-09 ENCOUNTER — Ambulatory Visit (INDEPENDENT_AMBULATORY_CARE_PROVIDER_SITE_OTHER): Payer: BC Managed Care – PPO | Admitting: Surgery

## 2014-01-09 ENCOUNTER — Encounter (INDEPENDENT_AMBULATORY_CARE_PROVIDER_SITE_OTHER): Payer: Self-pay | Admitting: Surgery

## 2014-01-09 MED ORDER — HYDROCODONE-ACETAMINOPHEN 7.5-325 MG/15ML PO SOLN: 10.0000 mL | Freq: Four times a day (QID) | ORAL | Status: DC | PRN

## 2014-01-09 NOTE — Progress Notes (Signed)
Chief Complaint:  Morbid obesity with a BMI 52  History of Present Illness:  Sergio Cline is an 38 y.o. male [place] who comes in today with his wife to discuss bariatric surgery options. He initially indicated an interest in the LAP-BAND has been doing more research and is interested in having a Roux-en-Y gastric bypass. I went over the procedure with him in some detail outlining the risk including bleeding and anastomotic leaks. He indicates that he feels these probably prediabetic and that this would be a better operation for him since he travels a lot and band fills would be difficult.  He has had an UGI which showed no hiatal hernia and ultrasound and no gallstones.    Currently using BIPAP.  No reported GERD.   Past Medical History  Diagnosis Date  . Morbid obesity   . GERD (gastroesophageal reflux disease)     hx of, none recent  . Sleep apnea     bipap has not been delivered yet, but will arrive soon    Past Surgical History  Procedure Laterality Date  . Appendectomy  1990  . Arthroscopic repair acl  1997    left knee  . Breath tek h pylori N/A 10/10/2013    Procedure: BREATH TEK H PYLORI;  Surgeon: Valarie MerinoMatthew B Fields Oros, MD;  [facility];  Service: General;  Laterality: N/A;    No current outpatient prescriptions on file.   No current facility-administered medications for this visit.   Review of patient's allergies indicates no known allergies. Family History  Problem Relation Age of Onset  . Cancer Maternal Grandmother     ? primary , lung metastases  . Lung cancer Maternal Grandfather     smoker  . Diabetes Neg Hx   . Stroke Neg Hx   . Heart disease Neg Hx    Social History:   reports that he has never smoked. He has never used smokeless tobacco. He reports that he does not drink alcohol or use illicit drugs.   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 He is otherwise been healthy. He has had an appendectomy. There is some positivity for some numbness in his hands these noticed recently.  Physical Exam:   Blood pressure 130/85, pulse 79, temperature 97.2 F (36.2 C), resp. rate 20, height 5\' 8"  (1.727 m), weight 344 lb 3.2 oz (156.128 kg). Body mass index is 52.35 kg/(m^2).  Gen:  [facility] WM NAD  Neurological: Alert and oriented to person, place, and time. Motor and sensory function is grossly intact  Head: Normocephalic and atraumatic.  Eyes: Conjunctivae are normal. Pupils are equal, round, and reactive to light. No scleral icterus.  Neck: Normal range of motion. Neck supple. No tracheal deviation or thyromegaly present.  Cardiovascular:  SR without murmurs or gallops.  No carotid bruits Respiratory: Effort normal.  No respiratory distress. No chest wall tenderness. Breath sounds normal.  No wheezes, rales or rhonchi.  Abdomen:  Obese.  Prior appendectomy GU: Musculoskeletal: Normal range of motion. Extremities are nontender. No cyanosis, edema or clubbing noted Lymphadenopathy: No cervical, preauricular, postauricular or axillary adenopathy is present Skin: Skin is warm and dry. No rash noted. No diaphoresis. No erythema. No pallor. Pscyh: Normal mood and affect. Behavior is normal. Judgment and thought content normal.   LABORATORY RESULTS: No results found for this or any previous  visit (from the past 48 hour(s)).  RADIOLOGY RESULTS: No results found.  Problem List: Patient Active Problem List   Diagnosis Date Noted  . OSA (obstructive sleep apnea) 09/20/2013  . GERD (gastroesophageal reflux disease) 09/20/2013  . Elevated LDL cholesterol level 09/20/2013  . Paresthesia of hand 09/20/2013  . Allergic rhinitis 05/12/2012  . HEMOPTYSIS UNSPECIFIED 11/13/2010  . Morbid obesity 10/01/2010  . 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 Questions answered and patient ready for surgery.    Matt B. Daphine DeutscherMartin, MD, Surgical Specialties LLCFACS  [facility] [phone] [phone]  01/09/2014 10:52 AM

## 2014-01-09 NOTE — Patient Instructions (Signed)

## 2014-01-13 NOTE — Anesthesia Preprocedure Evaluation (Addendum)
Anesthesia Evaluation  Patient identified by MRN, date of birth, ID band Patient awake    Reviewed: Allergy & Precautions, H&P , NPO status , Patient's Chart, lab work & pertinent test results  Airway Mallampati: II TM Distance: >3 FB Neck ROM: Full    Dental no notable dental hx. (+) Teeth Intact, Dental Advisory Given   Pulmonary sleep apnea ,  breath sounds clear to auscultation  Pulmonary exam normal       Cardiovascular negative cardio ROS  Rhythm:Regular Rate:Normal     Neuro/Psych negative neurological ROS  negative psych ROS   GI/Hepatic negative GI ROS, Neg liver ROS, GERD-  ,  Endo/Other  negative endocrine ROSMorbid obesity  Renal/GU negative Renal ROS  negative genitourinary   Musculoskeletal negative musculoskeletal ROS (+)   Abdominal (+) + obese,   Peds negative pediatric ROS (+)  Hematology negative hematology ROS (+)   Anesthesia Other Findings   Reproductive/Obstetrics negative OB ROS                          Anesthesia Physical Anesthesia Plan  ASA: III  Anesthesia Plan: General   Post-op Pain Management:    Induction: Intravenous  Airway Management Planned: Oral ETT  Additional Equipment:   Intra-op Plan:   Post-operative Plan: Extubation in OR  Informed Consent:   Plan Discussed with: Surgeon  Anesthesia Plan Comments:         Anesthesia Quick Evaluation

## 2014-01-14 ENCOUNTER — Encounter (HOSPITAL_COMMUNITY): Payer: BC Managed Care – PPO | Admitting: Anesthesiology

## 2014-01-14 ENCOUNTER — Inpatient Hospital Stay (HOSPITAL_COMMUNITY): Payer: BC Managed Care – PPO | Admitting: Anesthesiology

## 2014-01-14 ENCOUNTER — Inpatient Hospital Stay (HOSPITAL_COMMUNITY)
Admission: RE | Admit: 2014-01-14 | Discharge: 2014-01-16 | DRG: 621 | Disposition: A | Discharge status: Home / Self Care | Payer: BC Managed Care – PPO | Source: Ambulatory Visit | Attending: Surgery | Admitting: Surgery

## 2014-01-14 ENCOUNTER — Encounter (HOSPITAL_COMMUNITY): Payer: Self-pay | Admitting: *Deleted

## 2014-01-14 ENCOUNTER — Encounter (HOSPITAL_COMMUNITY)
Admission: RE | Disposition: A | Discharge status: Home / Self Care | Payer: Self-pay | Source: Ambulatory Visit | Attending: Surgery

## 2014-01-14 DIAGNOSIS — Z801 Family history of malignant neoplasm of trachea, bronchus and lung: Secondary | ICD-10-CM | POA: Diagnosis not present

## 2014-01-14 DIAGNOSIS — K2100 Gastro-esophageal reflux disease with esophagitis, without bleeding: Secondary | ICD-10-CM

## 2014-01-14 DIAGNOSIS — K219 Gastro-esophageal reflux disease without esophagitis: Secondary | ICD-10-CM | POA: Diagnosis present

## 2014-01-14 DIAGNOSIS — G4733 Obstructive sleep apnea (adult) (pediatric): Secondary | ICD-10-CM | POA: Diagnosis present

## 2014-01-14 DIAGNOSIS — Z6841 Body Mass Index (BMI) 40.0 and over, adult: Secondary | ICD-10-CM

## 2014-01-14 DIAGNOSIS — E7800 Pure hypercholesterolemia, unspecified: Secondary | ICD-10-CM | POA: Diagnosis present

## 2014-01-14 DIAGNOSIS — Z9884 Bariatric surgery status: Secondary | ICD-10-CM

## 2014-01-14 DIAGNOSIS — K458 Other specified abdominal hernia without obstruction or gangrene: Secondary | ICD-10-CM | POA: Diagnosis present

## 2014-01-14 HISTORY — PX: GASTRIC ROUX-EN-Y: SHX5262

## 2014-01-14 LAB — CBC
HCT: 41.4 % (ref 39.0–52.0)
Hemoglobin: 13.4 g/dL (ref 13.0–17.0)
MCH: 26.6 pg (ref 26.0–34.0)
MCHC: 32.4 g/dL (ref 30.0–36.0)
MCV: 82.3 fL (ref 78.0–100.0)
PLATELETS: 178 10*3/uL (ref 150–400)
RBC: 5.03 MIL/uL (ref 4.22–5.81)
RDW: 14.9 % (ref 11.5–15.5)
WBC: 14.6 10*3/uL — ABNORMAL HIGH (ref 4.0–10.5)

## 2014-01-14 LAB — HEMOGLOBIN AND HEMATOCRIT, BLOOD
HCT: 40.5 % (ref 39.0–52.0)
HEMOGLOBIN: 13.2 g/dL (ref 13.0–17.0)

## 2014-01-14 LAB — CREATININE, SERUM
CREATININE: 0.78 mg/dL (ref 0.50–1.35)
GFR calc Af Amer: >90 mL/min (ref >90)

## 2014-01-14 SURGERY — LAPAROSCOPIC ROUX-EN-Y GASTRIC BYPASS WITH UPPER ENDOSCOPY
Anesthesia: General

## 2014-01-14 MED ORDER — TISSEEL VH 10 ML EX KIT
PACK | CUTANEOUS | Status: AC
  Filled 2014-01-14: qty 2

## 2014-01-14 MED ORDER — CISATRACURIUM BESYLATE (PF) 10 MG/5ML IV SOLN
INTRAVENOUS | Status: DC | PRN
  Administered 2014-01-14: 12 mg via INTRAVENOUS
  Administered 2014-01-14: 4 mg via INTRAVENOUS
  Administered 2014-01-14: 2 mg via INTRAVENOUS
  Administered 2014-01-14: 6 mg via INTRAVENOUS

## 2014-01-14 MED ORDER — CISATRACURIUM BESYLATE 20 MG/10ML IV SOLN
INTRAVENOUS | Status: AC
  Filled 2014-01-14: qty 10

## 2014-01-14 MED ORDER — HEPARIN SODIUM (PORCINE) 5000 UNIT/ML IJ SOLN
5000.0000 [IU] | INTRAMUSCULAR | Status: AC
  Administered 2014-01-14: 5000 [IU] via SUBCUTANEOUS
  Filled 2014-01-14: qty 1

## 2014-01-14 MED ORDER — HYDROMORPHONE HCL PF 1 MG/ML IJ SOLN: 0.2500 mg | INTRAMUSCULAR | Status: DC | PRN

## 2014-01-14 MED ORDER — GLYCOPYRROLATE 0.2 MG/ML IJ SOLN
INTRAMUSCULAR | Status: AC
  Filled 2014-01-14: qty 3

## 2014-01-14 MED ORDER — ACETAMINOPHEN 160 MG/5ML PO SOLN: 325.0000 mg | ORAL | Status: DC | PRN

## 2014-01-14 MED ORDER — HYDROCODONE-ACETAMINOPHEN 7.5-325 MG/15ML PO SOLN
10.0000 mL | Freq: Four times a day (QID) | ORAL | Status: DC | PRN
  Administered 2014-01-15 – 2014-01-16 (×3): 10 mL via ORAL
  Filled 2014-01-14 (×2): qty 15

## 2014-01-14 MED ORDER — GLYCOPYRROLATE 0.2 MG/ML IJ SOLN
INTRAMUSCULAR | Status: DC | PRN
  Administered 2014-01-14: 0.6 mg via INTRAVENOUS

## 2014-01-14 MED ORDER — NEOSTIGMINE METHYLSULFATE 10 MG/10ML IV SOLN
INTRAVENOUS | Status: DC | PRN
  Administered 2014-01-14: 4 mg via INTRAVENOUS

## 2014-01-14 MED ORDER — ONDANSETRON HCL 4 MG/2ML IJ SOLN
4.0000 mg | INTRAMUSCULAR | Status: DC | PRN
  Administered 2014-01-14 – 2014-01-15 (×3): 4 mg via INTRAVENOUS
  Filled 2014-01-14 (×3): qty 2

## 2014-01-14 MED ORDER — SODIUM CHLORIDE 0.9 % IJ SOLN
INTRAMUSCULAR | Status: AC
  Filled 2014-01-14: qty 10

## 2014-01-14 MED ORDER — SUCCINYLCHOLINE CHLORIDE 20 MG/ML IJ SOLN
INTRAMUSCULAR | Status: DC | PRN
  Administered 2014-01-14: 140 mg via INTRAVENOUS

## 2014-01-14 MED ORDER — PROPOFOL 10 MG/ML IV BOLUS
INTRAVENOUS | Status: AC
  Filled 2014-01-14: qty 20

## 2014-01-14 MED ORDER — DEXTROSE 5 % IV SOLN
INTRAVENOUS | Status: AC
  Filled 2014-01-14: qty 2

## 2014-01-14 MED ORDER — FENTANYL CITRATE 0.05 MG/ML IJ SOLN
INTRAMUSCULAR | Status: AC
  Filled 2014-01-14: qty 5

## 2014-01-14 MED ORDER — UNJURY CHICKEN SOUP POWDER: 2.0000 [oz_av] | Freq: Four times a day (QID) | ORAL | Status: DC

## 2014-01-14 MED ORDER — LACTATED RINGERS IV SOLN: INTRAVENOUS | Status: DC

## 2014-01-14 MED ORDER — 0.9 % SODIUM CHLORIDE (POUR BTL) OPTIME
TOPICAL | Status: DC | PRN
  Administered 2014-01-14: 1000 mL

## 2014-01-14 MED ORDER — DEXAMETHASONE SODIUM PHOSPHATE 10 MG/ML IJ SOLN
INTRAMUSCULAR | Status: DC | PRN
  Administered 2014-01-14: 10 mg via INTRAVENOUS

## 2014-01-14 MED ORDER — CHLORHEXIDINE GLUCONATE CLOTH 2 % EX PADS: 6.0000 | MEDICATED_PAD | Freq: Once | CUTANEOUS | Status: DC

## 2014-01-14 MED ORDER — MIDAZOLAM HCL 2 MG/2ML IJ SOLN
INTRAMUSCULAR | Status: AC
  Filled 2014-01-14: qty 2

## 2014-01-14 MED ORDER — ACETAMINOPHEN 160 MG/5ML PO SOLN: 650.0000 mg | ORAL | Status: DC | PRN

## 2014-01-14 MED ORDER — PROMETHAZINE HCL 25 MG/ML IJ SOLN
6.2500 mg | INTRAMUSCULAR | Status: AC | PRN
  Administered 2014-01-14 (×2): 6.25 mg via INTRAVENOUS

## 2014-01-14 MED ORDER — NEOSTIGMINE METHYLSULFATE 10 MG/10ML IV SOLN
INTRAVENOUS | Status: AC
  Filled 2014-01-14: qty 1

## 2014-01-14 MED ORDER — EVICEL 5 ML EX KIT
PACK | CUTANEOUS | Status: DC | PRN
  Administered 2014-01-14: 1

## 2014-01-14 MED ORDER — MIDAZOLAM HCL 5 MG/5ML IJ SOLN
INTRAMUSCULAR | Status: DC | PRN
  Administered 2014-01-14: 2 mg via INTRAVENOUS

## 2014-01-14 MED ORDER — LACTATED RINGERS IV SOLN
INTRAVENOUS | Status: DC | PRN
  Administered 2014-01-14 (×3): via INTRAVENOUS

## 2014-01-14 MED ORDER — EPHEDRINE SULFATE 50 MG/ML IJ SOLN
INTRAMUSCULAR | Status: AC
  Filled 2014-01-14: qty 1

## 2014-01-14 MED ORDER — PROMETHAZINE HCL 25 MG/ML IJ SOLN
INTRAMUSCULAR | Status: AC
  Filled 2014-01-14: qty 1

## 2014-01-14 MED ORDER — ONDANSETRON HCL 4 MG/2ML IJ SOLN
INTRAMUSCULAR | Status: AC
  Filled 2014-01-14: qty 2

## 2014-01-14 MED ORDER — KCL IN DEXTROSE-NACL 20-5-0.45 MEQ/L-%-% IV SOLN
INTRAVENOUS | Status: DC
  Administered 2014-01-14 – 2014-01-16 (×4): via INTRAVENOUS
  Filled 2014-01-14 (×6): qty 1000

## 2014-01-14 MED ORDER — HYDROMORPHONE HCL PF 1 MG/ML IJ SOLN
INTRAMUSCULAR | Status: DC | PRN
  Administered 2014-01-14 (×4): 0.5 mg via INTRAVENOUS

## 2014-01-14 MED ORDER — BUPIVACAINE LIPOSOME 1.3 % IJ SUSP
20.0000 mL | Freq: Once | INTRAMUSCULAR | Status: AC
  Administered 2014-01-14: 20 mL
  Filled 2014-01-14: qty 20

## 2014-01-14 MED ORDER — ONDANSETRON HCL 4 MG/2ML IJ SOLN
INTRAMUSCULAR | Status: DC | PRN
  Administered 2014-01-14: 4 mg via INTRAVENOUS

## 2014-01-14 MED ORDER — ATROPINE SULFATE 0.4 MG/ML IJ SOLN
INTRAMUSCULAR | Status: AC
  Filled 2014-01-14: qty 1

## 2014-01-14 MED ORDER — DEXAMETHASONE SODIUM PHOSPHATE 10 MG/ML IJ SOLN
INTRAMUSCULAR | Status: AC
  Filled 2014-01-14: qty 1

## 2014-01-14 MED ORDER — LIDOCAINE HCL (CARDIAC) 20 MG/ML IV SOLN
INTRAVENOUS | Status: AC
  Filled 2014-01-14: qty 5

## 2014-01-14 MED ORDER — DEXTROSE 5 % IV SOLN
2.0000 g | INTRAVENOUS | Status: AC
  Administered 2014-01-14: 2 g via INTRAVENOUS

## 2014-01-14 MED ORDER — LIDOCAINE HCL (PF) 2 % IJ SOLN
INTRAMUSCULAR | Status: DC | PRN
Start: 2014-01-14 — End: 2014-01-14
  Administered 2014-01-14: 100 mg via INTRADERMAL

## 2014-01-14 MED ORDER — HEPARIN SODIUM (PORCINE) 5000 UNIT/ML IJ SOLN
5000.0000 [IU] | Freq: Three times a day (TID) | INTRAMUSCULAR | Status: DC
  Administered 2014-01-14 – 2014-01-16 (×6): 5000 [IU] via SUBCUTANEOUS
  Filled 2014-01-14 (×9): qty 1

## 2014-01-14 MED ORDER — ROCURONIUM BROMIDE 100 MG/10ML IV SOLN
INTRAVENOUS | Status: DC | PRN
  Administered 2014-01-14: 2 mg via INTRAVENOUS

## 2014-01-14 MED ORDER — UNJURY VANILLA POWDER: 2.0000 [oz_av] | Freq: Four times a day (QID) | ORAL | Status: DC

## 2014-01-14 MED ORDER — FENTANYL CITRATE 0.05 MG/ML IJ SOLN
INTRAMUSCULAR | Status: DC | PRN
  Administered 2014-01-14: 100 ug via INTRAVENOUS
  Administered 2014-01-14: 50 ug via INTRAVENOUS
  Administered 2014-01-14: 100 ug via INTRAVENOUS
  Administered 2014-01-14: 50 ug via INTRAVENOUS
  Administered 2014-01-14 (×2): 100 ug via INTRAVENOUS

## 2014-01-14 MED ORDER — HYDROMORPHONE HCL PF 2 MG/ML IJ SOLN
INTRAMUSCULAR | Status: AC
  Filled 2014-01-14: qty 1

## 2014-01-14 MED ORDER — PROPOFOL 10 MG/ML IV BOLUS
INTRAVENOUS | Status: DC | PRN
  Administered 2014-01-14: 200 mg via INTRAVENOUS

## 2014-01-14 MED ORDER — CEFOXITIN SODIUM 2 G IV SOLR
2.0000 g | Freq: Once | INTRAVENOUS | Status: AC
  Administered 2014-01-14: 2 g via INTRAVENOUS
  Filled 2014-01-14: qty 2

## 2014-01-14 MED ORDER — UNJURY CHOCOLATE CLASSIC POWDER: 2.0000 [oz_av] | Freq: Four times a day (QID) | ORAL | Status: DC

## 2014-01-14 MED ORDER — MORPHINE SULFATE 2 MG/ML IJ SOLN
2.0000 mg | INTRAMUSCULAR | Status: DC | PRN
  Administered 2014-01-14 (×3): 4 mg via INTRAVENOUS
  Administered 2014-01-14: 2 mg via INTRAVENOUS
  Administered 2014-01-15 (×3): 4 mg via INTRAVENOUS
  Filled 2014-01-14: qty 2
  Filled 2014-01-14: qty 1
  Filled 2014-01-14 (×5): qty 2

## 2014-01-14 MED ORDER — LACTATED RINGERS IR SOLN
Status: DC | PRN
  Administered 2014-01-14: 3000 mL

## 2014-01-14 MED ORDER — OXYCODONE HCL 5 MG/5ML PO SOLN
5.0000 mg | ORAL | Status: DC | PRN
  Administered 2014-01-15: 10 mg via ORAL
  Filled 2014-01-14 (×2): qty 10

## 2014-01-14 SURGICAL SUPPLY — 68 items
APPLICATOR COTTON TIP 6IN STRL (MISCELLANEOUS) ×6 IMPLANT
BENZOIN TINCTURE PRP APPL 2/3 (GAUZE/BANDAGES/DRESSINGS) IMPLANT
BLADE HEX COATED 2.75 (ELECTRODE) ×3 IMPLANT
BLADE SURG 15 STRL LF DISP TIS (BLADE) ×1 IMPLANT
BLADE SURG 15 STRL SS (BLADE) ×2
CABLE HIGH FREQUENCY MONO STRZ (ELECTRODE) IMPLANT
CANISTER SUCTION 2500CC (MISCELLANEOUS) ×3 IMPLANT
CLIP SUT LAPRA TY ABSORB (SUTURE) ×6 IMPLANT
CLOSURE WOUND 1/2 X4 (GAUZE/BANDAGES/DRESSINGS)
DERMABOND ADVANCED (GAUZE/BANDAGES/DRESSINGS) ×2
DERMABOND ADVANCED .7 DNX12 (GAUZE/BANDAGES/DRESSINGS) ×1 IMPLANT
DEVICE SUTURE ENDOST 10MM (ENDOMECHANICALS) ×3 IMPLANT
DISSECTOR BLUNT TIP ENDO 5MM (MISCELLANEOUS) ×3 IMPLANT
DRAIN PENROSE 18X1/4 LTX STRL (WOUND CARE) ×3 IMPLANT
DRAPE CAMERA CLOSED 9X96 (DRAPES) ×3 IMPLANT
ELECT CAUTERY BLADE 6.4 (BLADE) ×3 IMPLANT
GAUZE SPONGE 4X4 16PLY XRAY LF (GAUZE/BANDAGES/DRESSINGS) ×3 IMPLANT
GLOVE BIOGEL M 8.0 STRL (GLOVE) ×3 IMPLANT
GOWN STRL REUS W/TWL XL LVL3 (GOWN DISPOSABLE) ×15 IMPLANT
HANDLE STAPLE EGIA 4 XL (STAPLE) ×3 IMPLANT
HOVERMATT SINGLE USE (MISCELLANEOUS) ×3 IMPLANT
KIT BASIN OR (CUSTOM PROCEDURE TRAY) ×3 IMPLANT
KIT GASTRIC LAVAGE 34FR ADT (SET/KITS/TRAYS/PACK) ×3 IMPLANT
MANIFOLD NEPTUNE II (INSTRUMENTS) ×3 IMPLANT
MARKER SKIN DUAL TIP RULER LAB (MISCELLANEOUS) ×3 IMPLANT
NEEDLE SPNL 22GX3.5 QUINCKE BK (NEEDLE) ×3 IMPLANT
NS IRRIG 1000ML POUR BTL (IV SOLUTION) ×3 IMPLANT
PACK CARDIOVASCULAR III (CUSTOM PROCEDURE TRAY) ×3 IMPLANT
PENCIL BUTTON HOLSTER BLD 10FT (ELECTRODE) ×3 IMPLANT
RELOAD EGIA 45 MED/THCK PURPLE (STAPLE) ×3 IMPLANT
RELOAD EGIA 45 TAN VASC (STAPLE) ×3 IMPLANT
RELOAD EGIA 60 MED/THCK PURPLE (STAPLE) ×9 IMPLANT
RELOAD EGIA 60 TAN VASC (STAPLE) ×3 IMPLANT
RELOAD ENDO STITCH 2.0 (ENDOMECHANICALS) ×22
SCISSORS LAP 5X45 EPIX DISP (ENDOMECHANICALS) ×3 IMPLANT
SEALANT SURGICAL APPL DUAL CAN (MISCELLANEOUS) ×3 IMPLANT
SET IRRIG TUBING LAPAROSCOPIC (IRRIGATION / IRRIGATOR) ×3 IMPLANT
SHEARS CURVED HARMONIC AC 45CM (MISCELLANEOUS) ×3 IMPLANT
SLEEVE ADV FIXATION 12X100MM (TROCAR) ×9 IMPLANT
SLEEVE ADV FIXATION 5X100MM (TROCAR) IMPLANT
SOLUTION ANTI FOG 6CC (MISCELLANEOUS) ×3 IMPLANT
SPONGE GAUZE 4X4 12PLY (GAUZE/BANDAGES/DRESSINGS) IMPLANT
STAPLER VISISTAT 35W (STAPLE) ×3 IMPLANT
STRIP CLOSURE SKIN 1/2X4 (GAUZE/BANDAGES/DRESSINGS) IMPLANT
STRIP PERI DRY VERITAS 45 (STAPLE) ×3 IMPLANT
STRIP PERI DRY VERITAS 60 (STAPLE) ×3 IMPLANT
SUT RELOAD ENDO STITCH 2 48X1 (ENDOMECHANICALS) ×7
SUT RELOAD ENDO STITCH 2.0 (ENDOMECHANICALS) ×4
SUT VIC AB 2-0 SH 27 (SUTURE) ×2
SUT VIC AB 2-0 SH 27X BRD (SUTURE) ×1 IMPLANT
SUT VIC AB 4-0 SH 18 (SUTURE) ×3 IMPLANT
SUTURE RELOAD END STTCH 2 48X1 (ENDOMECHANICALS) ×7 IMPLANT
SUTURE RELOAD ENDO STITCH 2.0 (ENDOMECHANICALS) ×4 IMPLANT
SYR 20CC LL (SYRINGE) ×3 IMPLANT
SYR 30ML LL (SYRINGE) ×3 IMPLANT
SYR 50ML LL SCALE MARK (SYRINGE) ×3 IMPLANT
TOWEL OR 17X26 10 PK STRL BLUE (TOWEL DISPOSABLE) ×6 IMPLANT
TOWEL OR NON WOVEN STRL DISP B (DISPOSABLE) ×3 IMPLANT
TRAY FOLEY CATH 14FRSI W/METER (CATHETERS) IMPLANT
TRAY FOLEY CATH 16FRSI W/METER (SET/KITS/TRAYS/PACK) ×3 IMPLANT
TROCAR ADV FIXATION 12X100MM (TROCAR) ×3 IMPLANT
TROCAR ADV FIXATION 5X100MM (TROCAR) ×3 IMPLANT
TROCAR BLADELESS OPT 5 100 (ENDOMECHANICALS) ×3 IMPLANT
TROCAR XCEL 12X100 BLDLESS (ENDOMECHANICALS) ×3 IMPLANT
TUBING CONNECTING 10 (TUBING) ×2 IMPLANT
TUBING CONNECTING 10' (TUBING) ×1
TUBING ENDO SMARTCAP PENTAX (MISCELLANEOUS) ×3 IMPLANT
TUBING FILTER THERMOFLATOR (ELECTROSURGICAL) ×3 IMPLANT

## 2014-01-14 NOTE — Interval H&P Note (Signed)
History and Physical Interval Note:  01/14/2014 7:12 AM  Sergio GamesMarcelo D Ebanks  has presented today for surgery, with the diagnosis of morbid obesity   The various methods of treatment have been discussed with the patient and family. After consideration of risks, benefits and other options for treatment, the patient has consented to  Procedure(s): LAPAROSCOPIC ROUX-EN-Y GASTRIC BYPASS WITH UPPER ENDOSCOPY (N/A) as a surgical intervention .  The patient's history has been reviewed, patient examined, no change in status, stable for surgery.  I have reviewed the patient's chart and labs.  Questions were answered to the patient's satisfaction.     Valarie MerinoMatthew B Zilah Villaflor

## 2014-01-14 NOTE — Op Note (Signed)
Sergio GamesMarcelo D Angelo [id number] 05/31/1976 01/14/2014  Preoperative diagnosis: morbid obesity  Postoperative diagnosis: Same   Procedure: Upper endoscopy   Surgeon: Mary SellaEric M. Torey Reinard M.D., FACS   Anesthesia: Gen.   Indications for procedure: 38 yo male undergoing a laparoscopic roux en y gastric bypass and an upper endoscopy was requested to evaluate the anastomosis.  Description of procedure: After we have completed the new gastrojejunostomy, I scrubbed out and obtained the Olympus endoscope. I gently placed endoscope in the patient's oropharynx and gently glided it down the esophagus without any difficulty under direct visualization. Once I was in the gastric pouch, I insufflated the pouch was air. The pouch was approximately 5.5 cm (GE junction 44; anastomosis at 59 cm) in size. I was able to cannulate and advanced the scope through the gastrojejunostomy. Dr. Daphine DeutscherMartin had placed saline in the upper abdomen. Upon further insufflation of the gastric pouch there was no evidence of bubbles. Upon further inspection of the gastric pouch, the mucosa appeared normal. There is no evidence of any mucosal abnormality. The gastric pouch and Roux limb were decompressed. The width of the gastrojejunal anastomosis was at least 2.5 cm. The scope was withdrawn. The patient tolerated this portion of the procedure well. Please see Dr Ermalene SearingMartin's operative note for details regarding the laparoscopic roux-en-y gastric bypass.  Mary SellaEric M. Andrey CampanileWilson, MD, FACS General, Bariatric, & Minimally Invasive Surgery [facility] [place]

## 2014-01-14 NOTE — Anesthesia Postprocedure Evaluation (Signed)
  Anesthesia Post-op Note  Patient: Sergio Cline  Procedure(s) Performed: Procedure(s) (LRB): LAPAROSCOPIC ROUX-EN-Y GASTRIC BYPASS WITH UPPER ENDOSCOPY (N/A)  Patient Location: PACU  Anesthesia Type: General  Level of Consciousness: awake and alert   Airway and Oxygen Therapy: Patient Spontanous Breathing  Post-op Pain: mild  Post-op Assessment: Post-op Vital signs reviewed, Patient's Cardiovascular Status Stable, Respiratory Function Stable, Patent Airway and No signs of Nausea or vomiting  Last Vitals:  Filed Vitals:   01/14/14 1115  BP: 138/79  Pulse: 92  Temp:   Resp: 18    Post-op Vital Signs: stable   Complications: No apparent anesthesia complications

## 2014-01-14 NOTE — Transfer of Care (Signed)
Immediate Anesthesia Transfer of Care Note  Patient: Sergio Cline  Procedure(s) Performed: Procedure(s): LAPAROSCOPIC ROUX-EN-Y GASTRIC BYPASS WITH UPPER ENDOSCOPY (N/A)  Patient Location: PACU  Anesthesia Type:General  Level of Consciousness: awake, alert , oriented and patient cooperative  Airway & Oxygen Therapy: Patient Spontanous Breathing and Patient connected to face mask oxygen  Post-op Assessment: Report given to PACU RN, Post -op Vital signs reviewed and stable and Patient moving all extremities X 4  Post vital signs: Reviewed and stable  Complications: No apparent anesthesia complications

## 2014-01-14 NOTE — Op Note (Signed)
Surgeon: Pollyann SavoyMatt B. Daphine DeutscherMartin, MD, FACS Asst:  Gaynelle AduEric Wilson, MD, FACS Anesthesia: General endotracheal Drains: None  Procedure: Laparoscopic Roux en Y gastric bypass with 40 cm BP limb and 100 cm Roux limb, antecolic, antegastric, candy cane to the left.  Closure of Peterson's defect. Upper endoscopy.   Description of Procedure:  [facility] and given [facility] anesthesia.  The abdomen was prepped with PCMX and draped sterilely.  A time out was performed.    The operation began by identifying the ligament of Treitz. I measured 40 cm downstream and divided the bowel with a 6 cm Covidian stapler.  I sutured a Penrose drain along the Roux limb end.  I measured a 1 meter (100 cm) Roux limb and then placed the distal bowels to the BP limb side by side and performed a stapled jejunojejunostomy. The common defect was closed from either end with 2-0 Vicryl using the Endo Stitch. The mesenteric defect was closed with a running 2-0 silk using the Endo Stitch. Tisseel was applied to the suture line.  The omentum was divided with the harmonic scalpel.  The Nathanson retractor was inserted in the left lateral segment of liver was retracted. The foregut dissection ensued.  I measured 6 cm along the lessor curvature and entered the mesentery to divide into the retro gastric space.  Two purple 6 cm Covidien loads were fired followed by multiple firings of the purple load with peristrips to complete the pouch.   The Roux limb was then brought up with the candycane pointed left and a back row of sutures of 2-0 Vicryl were placed. I opened along the right side of each structure and inserted the 4.5 cm stapler to create the gastrojejunostomy. The common defect was closed from either end with 2-0 Vicryl and a second row was placed anterior to that the Ewald tube acting as a stent across the anastomosis. The Penrose drain was removed. Peterson's defect was closed with 2-0 silk.   Endoscopy was performed by  Dr. Andrey CampanileWilson.  Insufflation under saline showed no evidence of a leak or bleeding.    The incisions were injected with Exparel and were closed with 4-0 Vicryl and Dermabond.    The patient was taken to the recovery room in satisfactory condition.  Matt B. Daphine DeutscherMartin, MD, FACS

## 2014-01-14 NOTE — H&P (View-Only) (Signed)
Chief Complaint:  Morbid obesity with a BMI 52  History of Present Illness:  Sergio Cline is an 38 y.o. male [place] who comes in today with his wife to discuss bariatric surgery options. He initially indicated an interest in the LAP-BAND has been doing more research and is interested in having a Roux-en-Y gastric bypass. I went over the procedure with him in some detail outlining the risk including bleeding and anastomotic leaks. He indicates that he feels these probably prediabetic and that this would be a better operation for him since he travels a lot and band fills would be difficult.  He has had an UGI which showed no hiatal hernia and ultrasound and no gallstones.    Currently using BIPAP.  No reported GERD.   Past Medical History  Diagnosis Date  . Morbid obesity   . GERD (gastroesophageal reflux disease)     hx of, none recent  . Sleep apnea     bipap has not been delivered yet, but will arrive soon    Past Surgical History  Procedure Laterality Date  . Appendectomy  1990  . Arthroscopic repair acl  1997    left knee  . Breath tek h pylori N/A 10/10/2013    Procedure: BREATH TEK H PYLORI;  Surgeon: Valarie MerinoMatthew B Emonni Depasquale, MD;  [facility];  Service: General;  Laterality: N/A;    No current outpatient prescriptions on file.   No current facility-administered medications for this visit.   Review of patient's allergies indicates no known allergies. Family History  Problem Relation Age of Onset  . Cancer Maternal Grandmother     ? primary , lung metastases  . Lung cancer Maternal Grandfather     smoker  . Diabetes Neg Hx   . Stroke Neg Hx   . Heart disease Neg Hx    Social History:   reports that he has never smoked. He has never used smokeless tobacco. He reports that he does not drink alcohol or use illicit drugs.   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 He is otherwise been healthy. He has had an appendectomy. There is some positivity for some numbness in his hands these noticed recently.  Physical Exam:   Blood pressure 130/85, pulse 79, temperature 97.2 F (36.2 C), resp. rate 20, height 5\' 8"  (1.727 m), weight 344 lb 3.2 oz (156.128 kg). Body mass index is 52.35 kg/(m^2).  Gen:  WDWN WM NAD  Neurological: Alert and oriented to person, place, and time. Motor and sensory function is grossly intact  Head: Normocephalic and atraumatic.  Eyes: Conjunctivae are normal. Pupils are equal, round, and reactive to light. No scleral icterus.  Neck: Normal range of motion. Neck supple. No tracheal deviation or thyromegaly present.  Cardiovascular:  SR without murmurs or gallops.  No carotid bruits Respiratory: Effort normal.  No respiratory distress. No chest wall tenderness. Breath sounds normal.  No wheezes, rales or rhonchi.  Abdomen:  Obese.  Prior appendectomy GU: Musculoskeletal: Normal range of motion. Extremities are nontender. No cyanosis, edema or clubbing noted Lymphadenopathy: No cervical, preauricular, postauricular or axillary adenopathy is present Skin: Skin is warm and dry. No rash noted. No diaphoresis. No erythema. No pallor. Pscyh: Normal mood and affect. Behavior is normal. Judgment and thought content normal.   LABORATORY RESULTS: No results found for this or any previous  visit (from the past 48 hour(s)).  RADIOLOGY RESULTS: No results found.  Problem List: Patient Active Problem List   Diagnosis Date Noted  . OSA (obstructive sleep apnea) 09/20/2013  . GERD (gastroesophageal reflux disease) 09/20/2013  . Elevated LDL cholesterol level 09/20/2013  . Paresthesia of hand 09/20/2013  . Allergic rhinitis 05/12/2012  . HEMOPTYSIS UNSPECIFIED 11/13/2010  . Morbid obesity 10/01/2010  . 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 Questions answered and patient ready for surgery.    Matt B. Daphine DeutscherMartin, MD, [facility]  [facility] [phone] beeper [phone]  01/09/2014 10:52 AM

## 2014-01-14 NOTE — Progress Notes (Signed)
Pt able to place on mask when ready for bed. Pt and pt's family encouraged to call RT if needing assistance. No distress noted.

## 2014-01-14 NOTE — Progress Notes (Signed)
bipap placed in room for nighttime use.

## 2014-01-15 ENCOUNTER — Encounter (HOSPITAL_COMMUNITY): Payer: Self-pay | Admitting: Surgery

## 2014-01-15 ENCOUNTER — Inpatient Hospital Stay (HOSPITAL_COMMUNITY): Payer: BC Managed Care – PPO

## 2014-01-15 DIAGNOSIS — Z09 Encounter for follow-up examination after completed treatment for conditions other than malignant neoplasm: Secondary | ICD-10-CM

## 2014-01-15 LAB — CBC WITH DIFFERENTIAL/PLATELET
Basophils Absolute: 0 10*3/uL (ref 0.0–0.1)
Basophils Relative: 0 % (ref 0–1)
EOS ABS: 0 10*3/uL (ref 0.0–0.7)
EOS PCT: 0 % (ref 0–5)
HCT: 41.5 % (ref 39.0–52.0)
Hemoglobin: 13.3 g/dL (ref 13.0–17.0)
LYMPHS ABS: 1.5 10*3/uL (ref 0.7–4.0)
Lymphocytes Relative: 12 % (ref 12–46)
MCH: 26 pg (ref 26.0–34.0)
MCHC: 32 g/dL (ref 30.0–36.0)
MCV: 81.1 fL (ref 78.0–100.0)
Monocytes Absolute: 0.9 10*3/uL (ref 0.1–1.0)
Monocytes Relative: 7 % (ref 3–12)
NEUTROS PCT: 81 % — AB (ref 43–77)
Neutro Abs: 9.9 10*3/uL — ABNORMAL HIGH (ref 1.7–7.7)
PLATELETS: 219 10*3/uL (ref 150–400)
RBC: 5.12 MIL/uL (ref 4.22–5.81)
RDW: 14.9 % (ref 11.5–15.5)
WBC: 12.3 10*3/uL — ABNORMAL HIGH (ref 4.0–10.5)

## 2014-01-15 LAB — HEMOGLOBIN AND HEMATOCRIT, BLOOD
HEMATOCRIT: 40.4 % (ref 39.0–52.0)
HEMOGLOBIN: 12.8 g/dL — AB (ref 13.0–17.0)

## 2014-01-15 MED ORDER — IOHEXOL 300 MG/ML  SOLN
50.0000 mL | Freq: Once | INTRAMUSCULAR | Status: AC | PRN
  Administered 2014-01-15: 30 mL via ORAL

## 2014-01-15 NOTE — Progress Notes (Signed)
Patient ID: Sergio Cline, male   DOB: 1976/06/26, 39 y.o.   MRN: [id number] [facility] Progress Note:   1 Day Post-Op  Subjective: Mental status is clear.   Objective: Vital signs in last 24 hours: Temp:  [98 F (36.7 C)-98.8 F (37.1 C)] 98.4 F (36.9 C) (05/05 1253) Pulse Rate:  [70-104] 70 (05/05 1253) Resp:  [16-21] 18 (05/05 1253) BP: (104-143)/(66-84) 112/66 mmHg (05/05 1253) SpO2:  [97 %-100 %] 100 % (05/05 1253)  Intake/Output from previous day: 05/04 0701 - 05/05 0700 In: 4923.3 [I.V.:4873.3; IV Piggyback:50] Out: 2633 [Urine:4850] Intake/Output this shift:    Physical Exam: Work of breathing is not labored.    Lab Results:  Results for orders placed during the hospital encounter of 01/14/14 (from the past 48 hour(s))  HEMOGLOBIN AND HEMATOCRIT, BLOOD     Status: None   Collection Time    01/14/14 11:06 AM      Result Value Ref Range   Hemoglobin 13.2  13.0 - 17.0 g/dL   HCT 40.5  39.0 - 52.0 %  CBC     Status: Abnormal   Collection Time    01/14/14  1:02 PM      Result Value Ref Range   WBC 14.6 (*) 4.0 - 10.5 K/uL   RBC 5.03  4.22 - 5.81 MIL/uL   Hemoglobin 13.4  13.0 - 17.0 g/dL   HCT 41.4  39.0 - 52.0 %   MCV 82.3  78.0 - 100.0 fL   MCH 26.6  26.0 - 34.0 pg   MCHC 32.4  30.0 - 36.0 g/dL   RDW 14.9  11.5 - 15.5 %   Platelets 178  150 - 400 K/uL  CREATININE, SERUM     Status: None   Collection Time    01/14/14  1:02 PM      Result Value Ref Range   Creatinine, Ser 0.78  0.50 - 1.35 mg/dL   GFR calc non Af Amer >90  >90 mL/min   GFR calc Af Amer >90  >90 mL/min   Comment: (NOTE)     The eGFR has been calculated using the CKD EPI equation.     This calculation has not been validated in all clinical situations.     eGFR's persistently <90 mL/min signify possible Chronic Kidney     Disease.  CBC WITH DIFFERENTIAL     Status: Abnormal   Collection Time    01/15/14  4:08 AM      Result Value Ref Range   WBC 12.3 (*) 4.0 - 10.5 K/uL    RBC 5.12  4.22 - 5.81 MIL/uL   Hemoglobin 13.3  13.0 - 17.0 g/dL   HCT 41.5  39.0 - 52.0 %   MCV 81.1  78.0 - 100.0 fL   MCH 26.0  26.0 - 34.0 pg   MCHC 32.0  30.0 - 36.0 g/dL   RDW 14.9  11.5 - 15.5 %   Platelets 219  150 - 400 K/uL   Neutrophils Relative % 81 (*) 43 - 77 %   Neutro Abs 9.9 (*) 1.7 - 7.7 K/uL   Lymphocytes Relative 12  12 - 46 %   Lymphs Abs 1.5  0.7 - 4.0 K/uL   Monocytes Relative 7  3 - 12 %   Monocytes Absolute 0.9  0.1 - 1.0 K/uL   Eosinophils Relative 0  0 - 5 %   Eosinophils Absolute 0.0  0.0 - 0.7 K/uL   Basophils Relative  0  0 - 1 %   Basophils Absolute 0.0  0.0 - 0.1 K/uL    Radiology/Results: Dg Ugi W/water Sol Cm  01/15/2014   CLINICAL DATA:  Post Roux-en-Y gastric bypass  EXAM: WATER SOLUBLE UPPER GI SERIES  TECHNIQUE: Single-column upper GI series was performed using water soluble contrast.  CONTRAST:  52m OMNIPAQUE IOHEXOL 300 MG/ML  SOLN  COMPARISON:  10/10/2013  FLUOROSCOPY TIME:  27 seconds  FINDINGS: Scout radiograph demonstrates surgical sutures the left upper abdomen.  Normal gastric pouch.  No evidence of leak.  Patent gastrojejunostomy.  Normal efferent limb. Visualized portion of the afferent limb is unremarkable.  IMPRESSION: No evidence of leak.  Patent gastrojejunostomy.   Electronically Signed   By: SJulian HyM.D.   On: 01/15/2014 08:48    Anti-infectives: Anti-infectives   Start     Dose/Rate Route Frequency Ordered Stop   01/14/14 0930  cefOXitin (MEFOXIN) 2 g in dextrose 5 % 50 mL IVPB     2 g 100 mL/hr over 30 Minutes Intravenous  Once 01/14/14 0900 01/14/14 0930   01/14/14 0548  cefOXitin (MEFOXIN) 2 g in dextrose 5 % 50 mL IVPB     2 g 100 mL/hr over 30 Minutes Intravenous On call to O.R. 01/14/14 0548 01/14/14 0730      Assessment/Plan: Problem List: Patient Active Problem List   Diagnosis Date Noted  . 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 OSA (obstructive sleep apnea) 09/20/2013  . GERD  (gastroesophageal reflux disease) 09/20/2013  . Elevated LDL cholesterol level 09/20/2013  . Paresthesia of hand 09/20/2013  . Allergic rhinitis 05/12/2012  . HEMOPTYSIS UNSPECIFIED 11/13/2010  . Morbid obesity 10/01/2010  . PLANTAR FASCIITIS, BILATERAL 09/30/2009    UGI looked OK.  Start pd 1 diet 1 Day Post-Op    LOS: 1 day   Matt B. MHassell Done MD, [facility] [phone] [phone] 01/15/2014 2:45 PM

## 2014-01-15 NOTE — Progress Notes (Signed)
Patient alert and oriented, Post op day 1.  Provided support and encouragement.  Encouraged pulmonary toilet, ambulation and small sips of liquids when swallow study results are ok.  All questions answered.  Will continue to monitor.

## 2014-01-15 NOTE — Progress Notes (Signed)
VASCULAR LAB PRELIMINARY  PRELIMINARY  PRELIMINARY  PRELIMINARY  Bilateral lower extremity venous duplex  completed.    Preliminary report:  Bilateral:  No evidence of DVT, superficial thrombosis, or Baker's Cyst.    Gara KronerHelene F Neiko Trivedi, RVT 01/15/2014, 10:06 AM

## 2014-01-15 NOTE — Care Management Note (Signed)
    Page 1 of 1   01/15/2014     11:39:06 AM CARE MANAGEMENT NOTE 01/15/2014  Patient:  Sergio Cline,Sergio Cline   Account Number:  [id number]  Date Initiated:  01/15/2014  Documentation initiated by:  Lorenda IshiharaPEELE,Raelynne Ludwick  Subjective/Objective Assessment:   38 yo male admitted s/p gastric bypass. PTA lived at home with spouse.     Action/Plan:   Home when stable   Anticipated DC Date:  01/15/2014   Anticipated DC Plan:  HOME/SELF CARE      DC Planning Services  CM consult      Choice offered to / List presented to:             Status of service:  Completed, signed off Medicare Important Message given?  NA - LOS <3 / Initial given by admissions (If response is "NO", the following Medicare IM given date fields will be blank) Date Medicare IM given:   Date Additional Medicare IM given:    Discharge Disposition:  HOME/SELF CARE  Per UR Regulation:  Reviewed for med. necessity/level of care/duration of stay  If discussed at Long Length of Stay Meetings, dates discussed:    Comments:

## 2014-01-15 NOTE — Progress Notes (Signed)
Nutrition Education Note  Patient identified via diet education consult for DROP protocol.   Wt Readings from Last 5 Encounters:  01/14/14 346 lb (156.945 kg)  01/14/14 346 lb (156.945 kg)  01/09/14 344 lb 3.2 oz (156.128 kg)  12/28/13 359 lb (162.841 kg)  12/27/13 361 lb (163.749 kg)    Body mass index is 52.62 kg/(m^2). Patient meets criteria for morbid obesity based on current BMI.    Discussed 2 week post op diet with pt. Emphasized that liquids must be non carbonated, non caffeinated, and sugar free. Fluid goals discussed. Pt to follow up with outpatient bariatric RD for further diet progression after 2 weeks.   Diet: First 2 Weeks  You will see the nutritionist about two (2) weeks after your surgery. The nutritionist will increase the types of foods you can eat if you are handling liquids well:  If you have severe vomiting or nausea and cannot handle clear liquids lasting longer than 1 day, call your surgeon  Protein Shake  Drink at least 2 ounces of shake 5-6 times per day  Each serving of protein shakes (usually 8 - 12 ounces) should have a minimum of:  15 grams of protein  And no more than 5 grams of carbohydrate  Goal for protein each day:  Men = 80 grams per day  Women = 60 grams per day  Protein powder may be added to fluids such as non-fat milk or Lactaid milk or Soy milk (limit to 35 grams added protein powder per serving)   Hydration  Slowly increase the amount of water and other clear liquids as tolerated (See Acceptable Fluids)  Slowly increase the amount of protein shake as tolerated  Sip fluids slowly and throughout the day  May use sugar substitutes in small amounts (no more than 6 - 8 packets per day; i.e. Splenda)   Fluid Goal  The first goal is to drink at least 8 ounces of protein shake/drink per day (or as directed by the nutritionist); some examples of protein shakes are [employer] IndustriesSyntrax Nectar, Dillard'sdkins Advantage, EAS Edge HP, and Unjury. See handout from pre-op  Bariatric Education Class:  Slowly increase the amount of protein shake you drink as tolerated  You may find it easier to slowly sip shakes throughout the day  It is important to get your proteins in first  Your fluid goal is to drink 64 - 100 ounces of fluid daily  It may take a few weeks to build up to this  32 oz (or more) should be clear liquids  And  32 oz (or more) should be full liquids (see below for examples)  Liquids should not contain sugar, caffeine, or carbonation   Clear Liquids:  Water or Sugar-free flavored water (i.e. Fruit H2O, Propel)  Decaffeinated coffee or tea (sugar-free)  Crystal Lite, Wyler's Lite, Minute Maid Lite  Sugar-free Jell-O  Bouillon or broth  Sugar-free Popsicle: *Less than 20 calories each; Limit 1 per day   Full Liquids:  Protein Shakes/Drinks + 2 choices per day of other full liquids  Full liquids must be:  No More Than 12 grams of Carbs per serving  No More Than 3 grams of Fat per serving  Strained low-fat cream soup  Non-Fat milk  Fat-free Lactaid Milk  Sugar-free yogurt (Dannon Lite & Fit, Greek yogurt)   PinevilleHeather Baron MS, RD, LDN [phone] Pager [phone] After Hours Pager

## 2014-01-15 NOTE — Progress Notes (Signed)
Pt wife stated that Pt did not need any help placing BIPAP on tonight, wife notified to call if Pt needs assistance with BIPAP.  RT to monitor and assess as needed.

## 2014-01-15 NOTE — Progress Notes (Signed)
Patient alert and oriented, pain is controlled. Patient is tolerating fluids, plan to advance to protein shake tomorrow. Reviewed Gastric Bypass discharge instructions with patient and patient is able to articulate understanding. Provided information on BELT program, Support Group and [facility] outpatient pharmacy. All questions answered, will continue to monitor.    

## 2014-01-16 LAB — CBC WITH DIFFERENTIAL/PLATELET
BASOS PCT: 0 % (ref 0–1)
Basophils Absolute: 0 10*3/uL (ref 0.0–0.1)
Eosinophils Absolute: 0.2 10*3/uL (ref 0.0–0.7)
Eosinophils Relative: 2 % (ref 0–5)
HCT: 39.6 % (ref 39.0–52.0)
HEMOGLOBIN: 12.5 g/dL — AB (ref 13.0–17.0)
Lymphocytes Relative: 27 % (ref 12–46)
Lymphs Abs: 2.8 10*3/uL (ref 0.7–4.0)
MCH: 26.4 pg (ref 26.0–34.0)
MCHC: 31.6 g/dL (ref 30.0–36.0)
MCV: 83.5 fL (ref 78.0–100.0)
MONOS PCT: 7 % (ref 3–12)
Monocytes Absolute: 0.8 10*3/uL (ref 0.1–1.0)
NEUTROS PCT: 64 % (ref 43–77)
Neutro Abs: 6.7 10*3/uL (ref 1.7–7.7)
PLATELETS: 193 10*3/uL (ref 150–400)
RBC: 4.74 MIL/uL (ref 4.22–5.81)
RDW: 15.4 % (ref 11.5–15.5)
WBC: 10.5 10*3/uL (ref 4.0–10.5)

## 2014-01-16 NOTE — Discharge Instructions (Signed)

## 2014-01-16 NOTE — Progress Notes (Signed)
Patient alert and oriented, Post op day 2.  Provided support and encouragement.  Encouraged pulmonary toilet, ambulation and small sips of liquids.  All questions answered.  Will continue to monitor. 

## 2014-01-16 NOTE — Discharge Summary (Signed)
Physician Discharge Summary  Patient ID: Agapito GamesMarcelo D Wiers MRN: [id number] DOB/AGE: 39/02/1976 39 y.o.  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  UGI OK.  Diet advanced and ready for discharge on PD 2   Consults: none  Significant Diagnostic Studies: ugi    Discharge Exam: Blood pressure 123/82, pulse 74, temperature 99.4 F (37.4 C), temperature source Oral, resp. rate 18, height 5\' 8"  (1.727 m), weight 346 lb (156.945 kg), SpO2 92.00%. Incisions OK.  Minimal pain  Disposition: 01-Home or Self Care  Discharge Orders   Future Appointments Provider Department Dept Phone   01/21/2014 3:30 PM [facility] Post-Op Class [facility] [phone]   01/31/2014 9:00 AM Valarie MerinoMatthew B Darwin Rothlisberger, MD [facility] [place] [phone]   Future Orders Complete By Expires   Increase activity slowly  As directed    No wound care  As directed    Scheduling Instructions:   May shower and shampoo ad lib       Medication List         HYDROcodone-acetaminophen 7.5-325 mg/15 ml solution  Commonly known as:  HYCET  Take 10 mLs by mouth 4 (four) times daily as needed for moderate pain.           Follow-up Information   Follow up with Valarie MerinoMARTIN,Kathye Cipriani B, MD.   Specialty:  General Surgery   Contact information:   [address] [place] [address] [phone]       Signed: Valarie MerinoMatthew B Gladie Gravette 01/16/2014, 8:55 AM

## 2014-01-21 ENCOUNTER — Telehealth (HOSPITAL_COMMUNITY): Payer: Self-pay

## 2014-01-21 ENCOUNTER — Encounter: Payer: BC Managed Care – PPO | Attending: Surgery

## 2014-01-21 DIAGNOSIS — Z01818 Encounter for other preprocedural examination: Secondary | ICD-10-CM | POA: Insufficient documentation

## 2014-01-21 DIAGNOSIS — Z713 Dietary counseling and surveillance: Secondary | ICD-10-CM | POA: Insufficient documentation

## 2014-01-21 NOTE — Telephone Encounter (Signed)
Made discharge phone call to patient per DROP protocol. Asking the following questions.    1. Do you have someone to care for you now that you are home?  yes 2. Are you having pain now that is not relieved by your pain medication?  no 3. Are you able to drink the recommended daily amount of fluids (48 ounces minimum/day) and protein (60-80 grams/day) as prescribed by the dietitian or nutritional counselor?  yes 4. Are you taking the vitamins and minerals as prescribed?  yes 5. Do you have the "on call" number to contact your surgeon if you have a problem or question?  yes 6. Are your incisions free of redness, swelling or drainage? (If steri strips, address that these can fall off, shower as tolerated) yes 7. Have your bowels moved since your surgery?  If not, are you passing gas?   yes 8. Are you up and walking 3-4 times per day?  yes 9. Do you have an appointment to see a dietitian or nutritional counselor in the next month?  yes  The following questions can be added at the center's discharge phone call script at the center's discretion.  The questions are also captured within D.R.O.P. project custom fields. 1. Do you have an appointment made to see your surgeon in the next month?  yes 2. Were you provided your discharge medications before your surgery or before you were discharged from the hospital and are you taking them without problem?  yes 3. Were you provided phone numbers to the clinic/surgeon's office?  yes 4. Did you watch the patient education video module in the (clinic, surgeon's office, etc.) before your surgery? yes 5. Do you have a discharge checklist that was provided to you in the hospital to reference with instructions on how to take care of yourself after surgery?  yes 6. Did you see a dietitian or nutritional counselor while you were in the hospital?  yes   

## 2014-01-21 NOTE — Progress Notes (Signed)
Bariatric Class:  Appt start time: 1530 end time:  1600.  2 Week Post-Operative Nutrition Class  Patient was seen on 01/21/2014 for Post[facility]ative [facility]   Surgery date: 01/14/2014  Surgery type: RYGB  Start weight at [facility]: 357 on 10/20/13  Weight today: 357.5 lbs  327.0 Weight loss: 30 lbs  TANITA BODY COMP RESULTS   12/24/13  01/21/14  BMI (kg/m^2)  54.4  49.7  Fat Mass (lbs)  158.5  156.5  Fat Free Mass (lbs)  199  170.5  Total Body Water (lbs)  145.5  125.0     The following the learning objectives were met by the patient during this course:  Identifies Phase 3A (Soft, High Proteins) Dietary Goals and will begin from 2 weeks post-operatively to 2 months post-operatively  Identifies appropriate sources of fluids and proteins   States protein recommendations and appropriate sources post-operatively  Identifies the need for appropriate texture modifications, mastication, and bite sizes when consuming solids  Identifies appropriate multivitamin and calcium sources post-operatively  Describes the need for physical activity post-operatively and will follow MD recommendations  States when to call healthcare provider regarding medication questions or post-operative complications  Handouts given during class include:  Phase 3A: Soft, High Protein Diet Handout  Follow-Up Plan: Patient will follow-up at [facility] in 7 weeks for 2 month post-op nutrition visit for diet advancement per MD.

## 2014-01-28 ENCOUNTER — Encounter (INDEPENDENT_AMBULATORY_CARE_PROVIDER_SITE_OTHER): Payer: Self-pay

## 2014-01-29 ENCOUNTER — Ambulatory Visit: Payer: BC Managed Care – PPO

## 2014-01-31 ENCOUNTER — Encounter (INDEPENDENT_AMBULATORY_CARE_PROVIDER_SITE_OTHER): Payer: Self-pay | Admitting: Surgery

## 2014-01-31 ENCOUNTER — Ambulatory Visit (INDEPENDENT_AMBULATORY_CARE_PROVIDER_SITE_OTHER): Payer: BC Managed Care – PPO | Admitting: Surgery

## 2014-01-31 ENCOUNTER — Encounter (INDEPENDENT_AMBULATORY_CARE_PROVIDER_SITE_OTHER): Payer: Self-pay

## 2014-01-31 VITALS — BP 122/76 | HR 80 | Temp 97.1°F | Resp 16 | Ht 68.0 in | Wt 321.0 lb

## 2014-01-31 DIAGNOSIS — Z9884 Bariatric surgery status: Secondary | ICD-10-CM

## 2014-01-31 NOTE — Progress Notes (Signed)
Sergio Cline 38 y.o.  Body mass index is 48.82 kg/(m^2).  Patient Active Problem List   Diagnosis Date Noted  . 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 OSA (obstructive sleep apnea) 09/20/2013  . GERD (gastroesophageal reflux disease) 09/20/2013  . Elevated LDL cholesterol level 09/20/2013  . Paresthesia of hand 09/20/2013  . Allergic rhinitis 05/12/2012  . HEMOPTYSIS UNSPECIFIED 11/13/2010  . Morbid obesity 10/01/2010  . PLANTAR FASCIITIS, BILATERAL 09/30/2009    No Known Allergies  Past Surgical History  Procedure Laterality Date  . Appendectomy  1990  . Arthroscopic repair acl  1997    left knee  . Breath tek h pylori N/A 10/10/2013    Procedure: BREATH TEK H PYLORI;  Surgeon: Valarie MerinoMatthew B Orrie Schubert, MD;  [facility];  Service: General;  Laterality: N/A;  . Gastric roux-en-y N/A 01/14/2014    Procedure: LAPAROSCOPIC ROUX-EN-Y GASTRIC BYPASS WITH UPPER ENDOSCOPY;  Surgeon: Valarie MerinoMatthew B Lamarcus Spira, MD;  Location: [facility];  Service: General;  Laterality: N/A;   Marga MelnickWilliam Hopper, MD No diagnosis found.  Doing very well.  I answered questions about his surgery.  Incisions OK.  Will see back in 6 weeks Matt B. Daphine DeutscherMartin, MD, [facility]  [facility] [phone] beeper [phone]  01/31/2014 9:51 AM

## 2014-01-31 NOTE — Patient Instructions (Signed)
Ok to get in to water aerobics.  Weight resistance training without straining is OK as well

## 2014-03-14 ENCOUNTER — Encounter: Payer: BC Managed Care – PPO | Attending: Surgery | Admitting: Dietician

## 2014-03-14 DIAGNOSIS — Z01818 Encounter for other preprocedural examination: Secondary | ICD-10-CM | POA: Insufficient documentation

## 2014-03-14 DIAGNOSIS — Z713 Dietary counseling and surveillance: Secondary | ICD-10-CM | POA: Insufficient documentation

## 2014-03-14 NOTE — Progress Notes (Signed)
  Follow-up visit:  8 Weeks Post-Operative RYGB Surgery  Medical Nutrition Therapy:  Appt start time: 1730 end time:  1800.  Primary concerns today: Post-operative Bariatric Surgery Nutrition Management. Returns with a 36.5 lbs weight loss. Tolerating foods ok and following the diet plan most of the time. Has tried "3 bites" of foods off of the program and found that he did not enjoy it as much as before. No longer likes eggs. Stops eating when satisfied.   Surgery date: 01/14/2014  Surgery type: RYGB  [facility] on 10/20/13  Weight today: 290 lbs  Weight loss: 36.5 lbs Total weight loss: 67 lbs   TANITA BODY COMP RESULTS   12/24/13  01/21/14 03/14/14  BMI (kg/m^2)  54.4  49.7 44.2  Fat Mass (lbs)  158.5  156.5 105.0  Fat Free Mass (lbs)  199  170.5 185.5  Total Body Water (lbs)  145.5  125.0 136.0    Preferred Learning Style:   No preference indicated   Learning Readiness:   Ready  24-hr recall: B (AM): Premier shake (30 g) Snk (AM): sugar free jello or Dannon light and fit (0-12 g) L (PM): ceviche with tortilla (14 - 21g) Snk (PM): not usually or cheese stick (6 g)  D (PM): 1/2 cup beans with 1/2 frank (12 g) Snk (PM): "a little bit of a shake" (10-15 g)  Fluid intake: water, Powerade, protein shakes (64 oz+) Estimated total protein intake: 65-90  Medications: none Supplementation: taking  Using straws: No, tried once and had a lot of air in stomach Drinking while eating: No Hair loss: No Carbonated beverages: Tried but "tasted nasty" N/V/D/C: No Dumping syndrome: None  Recent physical activity:  30 minutes on treadmill (run/walk), weight lifting everyday   Progress Towards Goal(s):  In progress.  Handouts given during visit include:  Phase 3B High Protein + Non Starchy Vegetables   Nutritional Diagnosis:  [place]-3.3 Overweight/obesity related to past poor dietary habits and physical inactivity as evidenced by patient w/ recent RYGB surgery following  dietary guidelines for continued weight loss.    Intervention:  Nutrition education/diet advancement.  Teaching Method Utilized:  Visual Auditory Hands on  Barriers to learning/adherence to lifestyle change: none  Demonstrated degree of understanding via:  Teach Back   Monitoring/Evaluation:  Dietary intake, exercise, lap band fills, and body weight. Follow up in 4 weeks months for 3 month post-op visit.

## 2014-03-14 NOTE — Patient Instructions (Addendum)
Goals:  Follow Phase 3B: High Protein + Non-Starchy Vegetables  Eat 3-6 small meals/snacks, every 3-5 hrs  Increase lean protein foods to meet 80g goal  Increase fluid intake to 64oz +  Avoid drinking 15 minutes before, during and 30 minutes after eating  Aim for >30 min of physical activity daily  Surgery date: 01/14/2014  Surgery type: RYGB  [facility]: 357 on 10/20/13  Weight today: 290 lbs  Weight loss: 36.5 lbs Total weight loss: 67 lbs   TANITA BODY COMP RESULTS   12/24/13  01/21/14 03/14/14  BMI (kg/m^2)  54.4  49.7 44.2  Fat Mass (lbs)  158.5  156.5 105.0  Fat Free Mass (lbs)  199  170.5 185.5  Total Body Water (lbs)  145.5  125.0 136.0

## 2014-03-18 ENCOUNTER — Ambulatory Visit (INDEPENDENT_AMBULATORY_CARE_PROVIDER_SITE_OTHER): Payer: BC Managed Care – PPO | Admitting: Family Medicine

## 2014-03-18 ENCOUNTER — Encounter: Payer: Self-pay | Admitting: Family Medicine

## 2014-03-18 VITALS — BP 104/60 | HR 88 | Temp 98.4°F | Wt 288.2 lb

## 2014-03-18 DIAGNOSIS — R0982 Postnasal drip: Secondary | ICD-10-CM

## 2014-03-18 DIAGNOSIS — B369 Superficial mycosis, unspecified: Secondary | ICD-10-CM

## 2014-03-18 DIAGNOSIS — Z7184 Encounter for health counseling related to travel: Secondary | ICD-10-CM | POA: Insufficient documentation

## 2014-03-18 DIAGNOSIS — Z7189 Other specified counseling: Secondary | ICD-10-CM

## 2014-03-18 MED ORDER — CLOTRIMAZOLE-BETAMETHASONE 1-0.05 % EX CREA: 1.0000 "application " | TOPICAL_CREAM | Freq: Two times a day (BID) | CUTANEOUS | Status: DC

## 2014-03-18 MED ORDER — FLUTICASONE PROPIONATE 50 MCG/ACT NA SUSP: 2.0000 | Freq: Every day | NASAL | Status: DC

## 2014-03-18 MED ORDER — AMOXICILLIN 400 MG/5ML PO SUSR: ORAL | Status: DC

## 2014-03-18 NOTE — Progress Notes (Signed)
Pre visit review using our clinic review tool, if applicable. No additional management support is needed unless otherwise documented below in the visit note. 

## 2014-03-18 NOTE — Assessment & Plan Note (Signed)
New.  Pt reports [place] is traveling to [place] to [place] and multiple family members are sick.  He is worried that if he doesn't take abx w/ him he will be forced to take a pill- and in the setting of recent gastric bypass, isn't sure if he'd be able to.  Script for liquid Amox given to use as needed.

## 2014-03-18 NOTE — Assessment & Plan Note (Signed)
New.  Start topical lotrisone.  Reviewed supportive care and red flags that should prompt return.  Pt expressed understanding and is in agreement w/ plan.

## 2014-03-18 NOTE — Patient Instructions (Signed)
Follow up as needed Start the Flonase- 2 sprays each nostril daily- for the sore throat Start Zyrtec daily for the allergy component Drink plenty of fluids Use the Amoxicillin if needed for infection while traveling Use the Lotrisone cream twice daily on your thighs Call with any questions or concerns Safe and happy travels!

## 2014-03-18 NOTE — Assessment & Plan Note (Signed)
No evidence of infection.  + PND visible.  Suspect this is cause of pt's sore throat.  Treat nasal allergies to improve pt's sxs.  Reviewed supportive care and red flags that should prompt return.  Pt expressed understanding and is in agreement w/ plan.

## 2014-03-18 NOTE — Progress Notes (Signed)
   Subjective:    Patient ID: Sergio Cline, male    DOB: 01/20/1976, 38 y.o.   MRN: [id number]  HPI Sore throat- sxs stated 2 days ago, worse in the evenings and in the AM.  No fever.  No cough.  + PND.  No known sick contacts.  No nausea.  Travel concerns- pt is going to the [place] where it is winter and multiple family members are sick.  Pt is asking for abx to take w/ him (in liquid form) due to gastric bypass so he doesn't get stuck taking large pills.  Fungal dermatitis- pt has lost 67 lbs in 8 weeks since gastric bypass and now having loose skin that is chafing and painful during his daily exercise.  Rash is now both itchy and painful.  Has been using 'diaper rash' cream w/o relief.  Some improvement w/ neosporin.   Review of Systems For ROS see HPI     Objective:   Physical Exam  Vitals reviewed. Constitutional: He appears well-developed and well-nourished. No distress.  HENT:  Head: Normocephalic and atraumatic.  No TTP over sinuses + turbinate edema + PND TMs normal bilaterally  Eyes: Conjunctivae and EOM are normal. Pupils are equal, round, and reactive to light.  Neck: Normal range of motion. Neck supple.  Cardiovascular: Normal rate, regular rhythm and normal heart sounds.   Pulmonary/Chest: Effort normal and breath sounds normal. No respiratory distress. He has no wheezes.  Lymphadenopathy:    He has no cervical adenopathy.  Skin: Skin is warm and dry. Rash (fungal dermatitis in groin bilaterally) noted.          Assessment & Plan:

## 2014-03-21 ENCOUNTER — Telehealth (INDEPENDENT_AMBULATORY_CARE_PROVIDER_SITE_OTHER): Payer: Self-pay

## 2014-03-21 NOTE — Telephone Encounter (Signed)
Called and left message for patient to call our office regarding appointment being rescheduled to 04/10/14 @ 2:50pm w/ Dr. Daphine DeutscherMartin

## 2014-03-22 ENCOUNTER — Ambulatory Visit (INDEPENDENT_AMBULATORY_CARE_PROVIDER_SITE_OTHER): Payer: BC Managed Care – PPO | Admitting: Surgery

## 2014-04-10 ENCOUNTER — Ambulatory Visit (INDEPENDENT_AMBULATORY_CARE_PROVIDER_SITE_OTHER): Payer: BC Managed Care – PPO | Admitting: Surgery

## 2014-05-21 ENCOUNTER — Ambulatory Visit: Payer: BC Managed Care – PPO | Admitting: Dietician

## 2014-05-23 ENCOUNTER — Encounter: Payer: BC Managed Care – PPO | Attending: Surgery | Admitting: Dietician

## 2014-05-23 DIAGNOSIS — Z01818 Encounter for other preprocedural examination: Secondary | ICD-10-CM | POA: Insufficient documentation

## 2014-05-23 DIAGNOSIS — Z713 Dietary counseling and surveillance: Secondary | ICD-10-CM | POA: Insufficient documentation

## 2014-05-23 NOTE — Progress Notes (Signed)
  Follow-up visit: 4 Months Post-Operative RYGB Surgery  Medical Nutrition Therapy:  Appt start time: 1730 end time:  1800.  Primary concerns today: Post-operative Bariatric Surgery Nutrition Management. Returns with a 35 lbs weight loss. Has lost over 100 lbs. Has been tolerating fish (ceviche) better than red meat or chicken, which he doesn't like as much before. Went to [place] for a month over the summer.  Working out a lot more. Starting to jog. Really enjoying working out.   Surgery date: 01/14/2014  Surgery type: RYGB  Start weight at [facility]: 357 on 10/20/13  Weight today: 255.0 lbs   Weight change: 35 lbs  Total weight loss: 102 lbs  Weight loss goal: 180 lbs  TANITA BODY COMP RESULTS   12/24/13  01/21/14 03/14/14 05/23/14  BMI (kg/m^2)  54.4  49.7 44.2 38.8  Fat Mass (lbs)  158.5  156.5 105.0 84.0  Fat Free Mass (lbs)  199  170.5 185.5 171.0  Total Body Water (lbs)  145.5  125.0 136.0 125.0    Preferred Learning Style:   No preference indicated   Learning Readiness:   Ready  24-hr recall: B (AM): Premier shake (30 g) Snk (AM): sugar free jello or Dannon light and fit (0-12 g) L (PM): ceviche with tortilla with green salad (14 - 21g)  Snk (PM): not usually or cheese stick (6 g)  D (PM): 1/2 cup beans with 1/2 frank (12 g) Snk (PM): "a little bit of a shake" (10-15 g)  Fluid intake: water, Powerade, protein shakes (64 oz+) Estimated total protein intake: 65-90  Medications: none Supplementation: taking  Using straws: No, tried once and had a lot of air in stomach Drinking while eating: No Hair loss: Yes - added biotin and extra vitamins (recommended to stop extra vitamins) Carbonated beverages: Tried but "tasted nasty" N/V/D/C: No Dumping syndrome: None  Recent physical activity:  30 minutes on treadmill (run/walk) 5-6 x week, weight lifting   Progress Towards Goal(s):  In progress.   Nutritional Diagnosis:  [place]-3.3 Overweight/obesity related to past poor dietary  habits and physical inactivity as evidenced by patient w/ recent RYGB surgery following dietary guidelines for continued weight loss.    Intervention:  Nutrition education/diet advancement.  Teaching Method Utilized:  Visual Auditory Hands on  Barriers to learning/adherence to lifestyle change: none  Demonstrated degree of understanding via:  Teach Back   Monitoring/Evaluation:  Dietary intake, exercise, lap band fills, and body weight. Follow up in 3 months for 6 month post-op visit.

## 2014-05-23 NOTE — Patient Instructions (Addendum)
Goals:  Follow Phase 3B: High Protein + Non-Starchy Vegetables  Eat 3-6 small meals/snacks, every 3-5 hrs  Increase lean protein foods to meet 80g goal  Increase fluid intake to 64oz +  Avoid drinking 15 minutes before, during and 30 minutes after eating  Aim for >30 min of physical activity daily  Surgery date: 01/14/2014  Surgery type: RYGB  Start weight at [facility]: 357 on 10/20/13  Weight today: 255.0 lbs   Weight change: 35 lbs  Total weight loss: 102 lbs  Weight loss goal: 180 lbs  TANITA BODY COMP RESULTS   12/24/13  01/21/14 03/14/14 05/23/14  BMI (kg/m^2)  54.4  49.7 44.2 38.8  Fat Mass (lbs)  158.5  156.5 105.0 84.0  Fat Free Mass (lbs)  199  170.5 185.5 171.0  Total Body Water (lbs)  145.5  125.0 136.0 125.0

## 2014-08-27 IMAGING — US US ABDOMEN COMPLETE
1 series · 14 of 25 positions shown · non-contrast
Comparison: None.

CLINICAL DATA: Preop gastric bypass surgery

EXAM:
ULTRASOUND ABDOMEN COMPLETE

[Series 1: us abdomen complete · 14 of 76 slices shown]
[im 1/76]
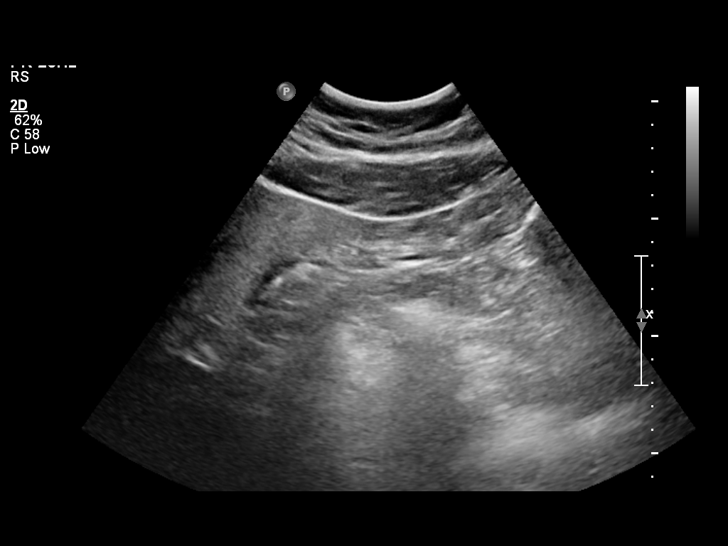
[im 7/76]
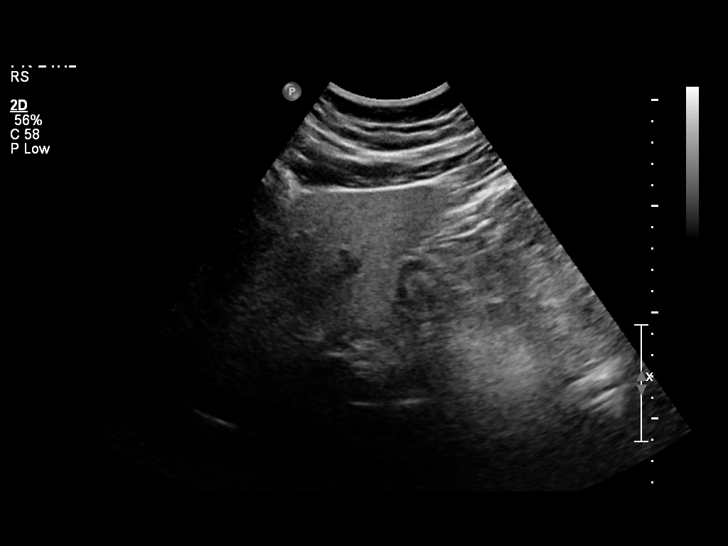
[im 13/76]
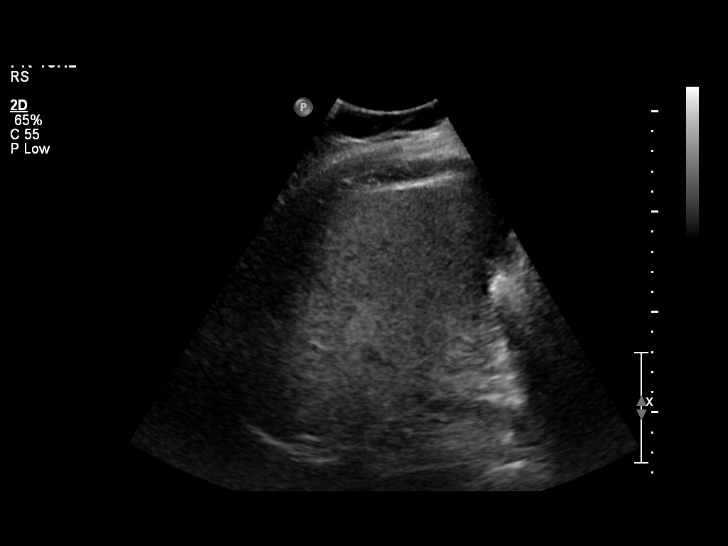
[im 19/76]
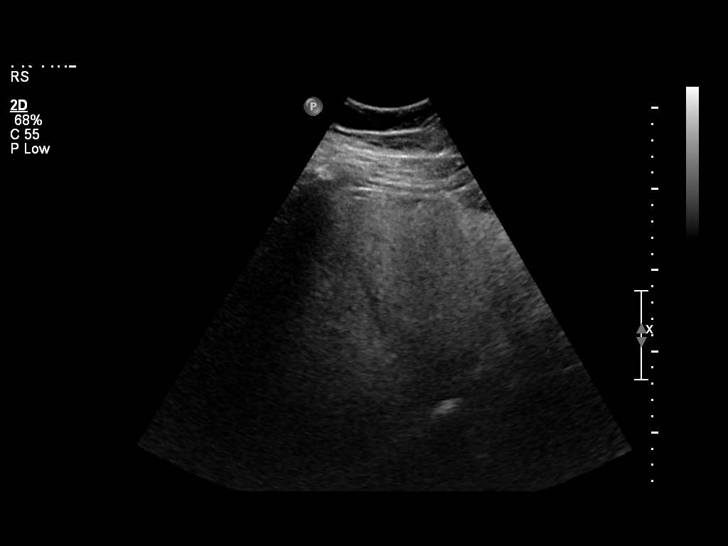
[im 26/76]
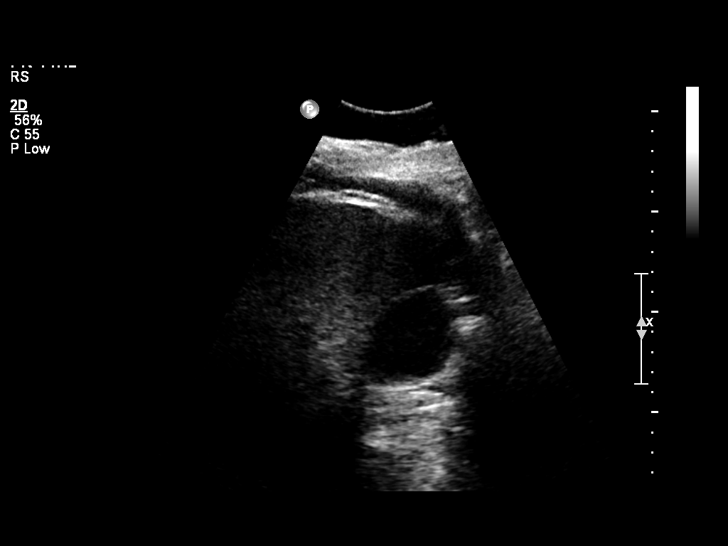
[im 29/76]
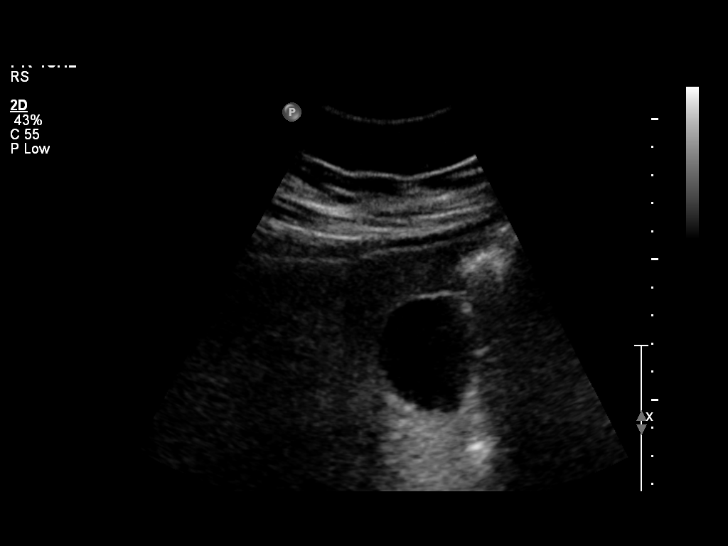
[im 35/76]
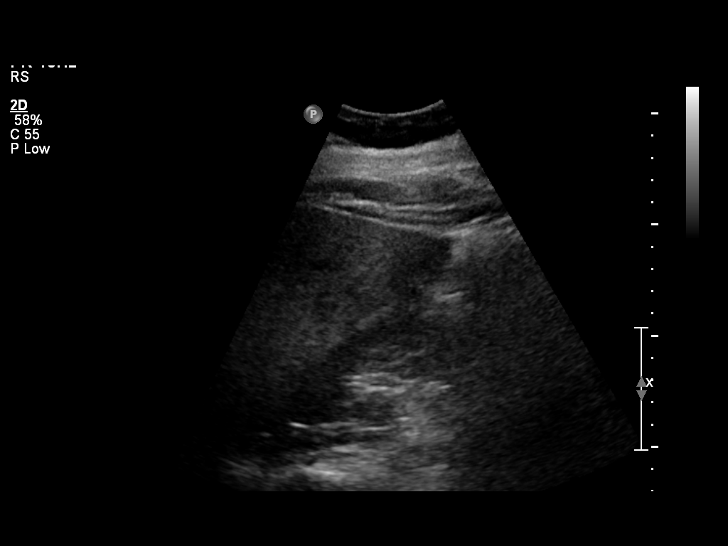
[im 41/76]
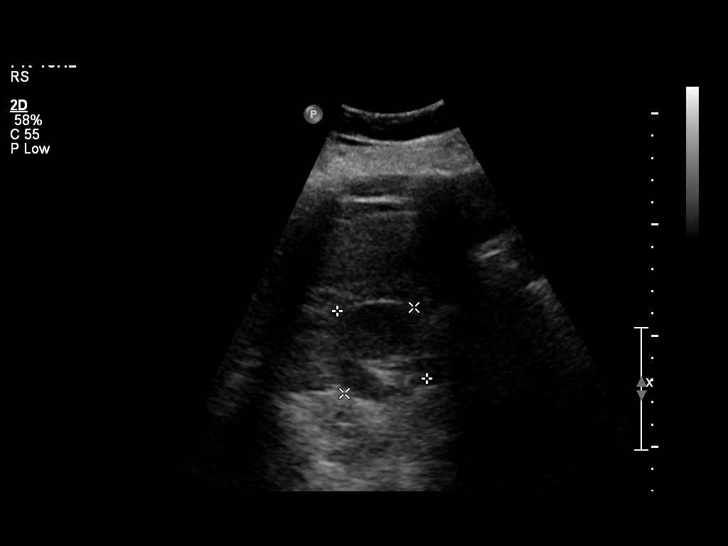
[im 47/76]
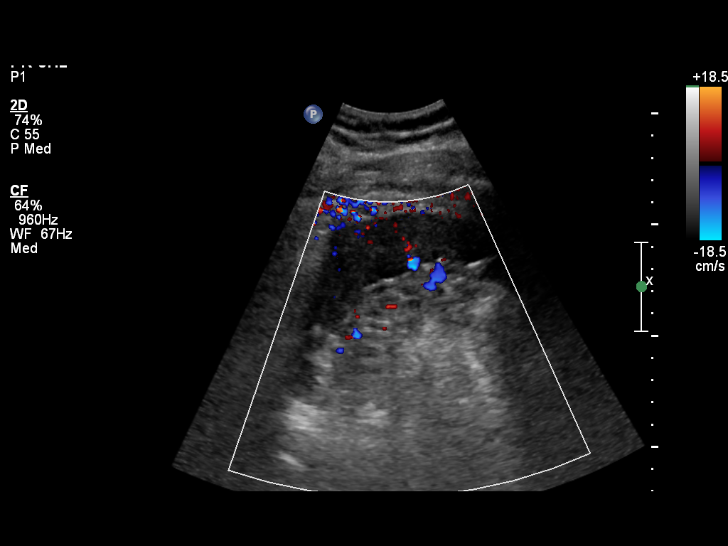
[im 51/76]
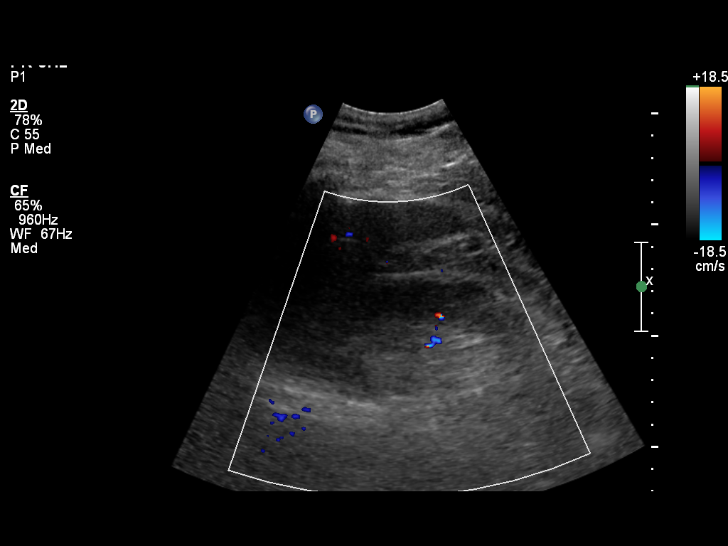
[im 57/76]
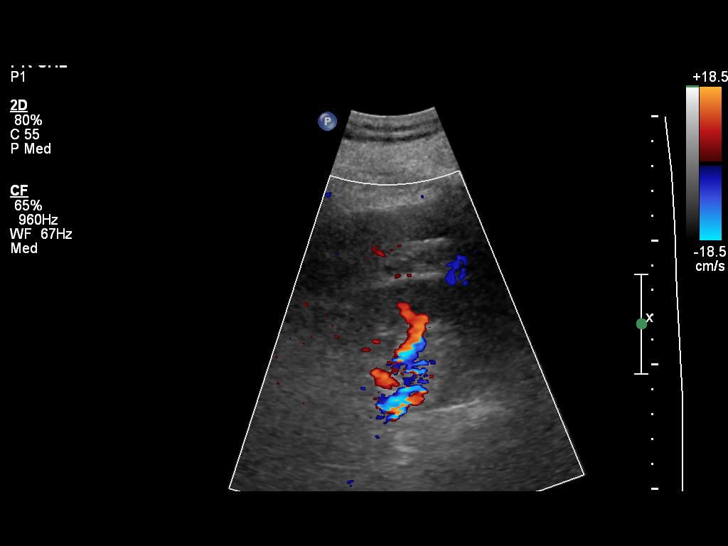
[im 63/76]
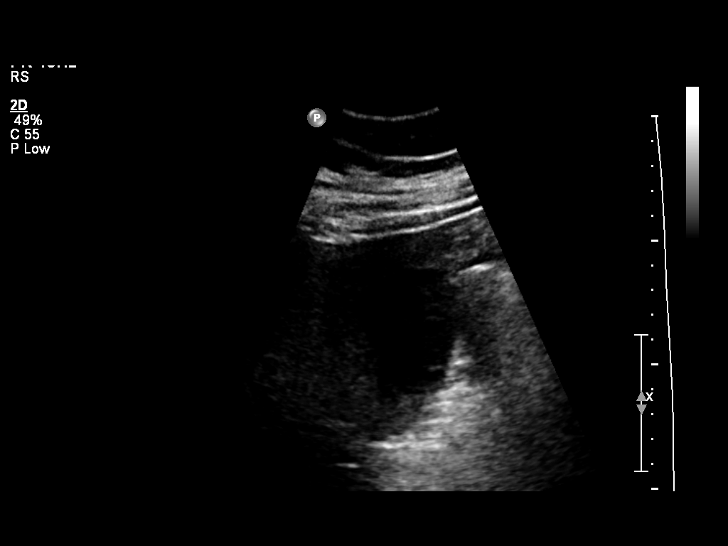
[im 69/76]
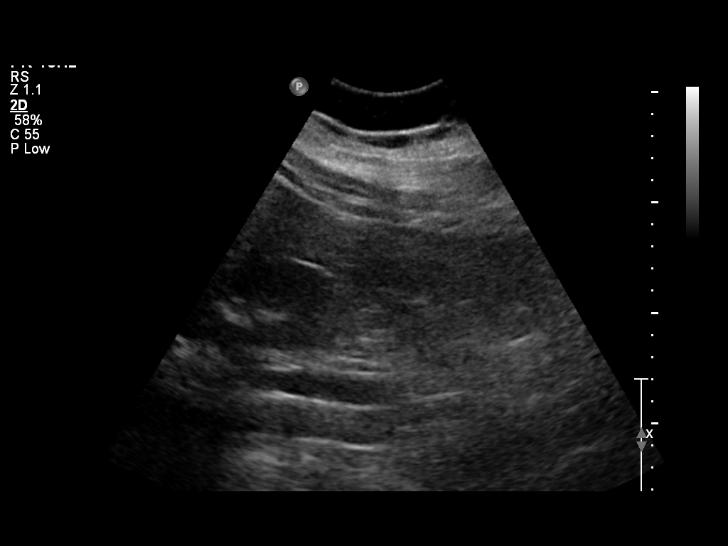
[im 76/76]
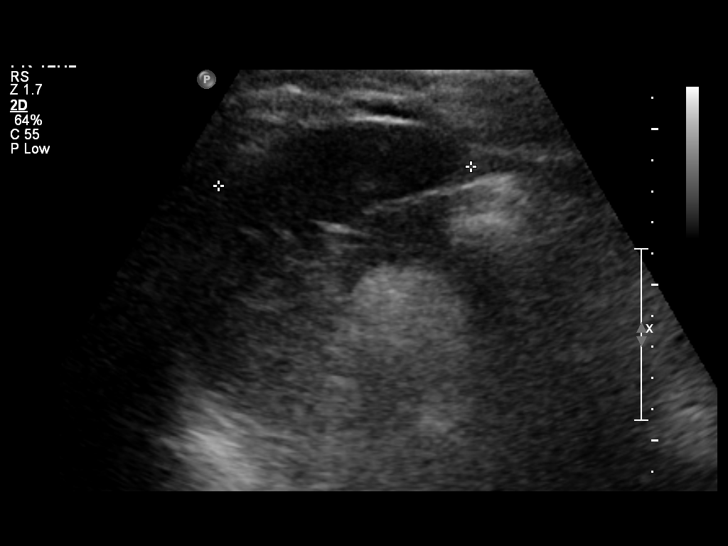

[14 of 25 positions shown; findings below may reference images not displayed]

FINDINGS: Gallbladder:

No gallstones or wall thickening visualized. No sonographic Murphy
sign noted.

Common bile duct:

Diameter: 4 mm

Liver:

No focal lesion identified. Within normal limits in parenchymal
echogenicity.

IVC:

No abnormality visualized.

Pancreas:

Visualized portion unremarkable.

Spleen:

Size and appearance within normal limits.

Right Kidney:

Length: 12.5 cm. Echogenicity within normal limits. No mass or
hydronephrosis visualized.

Left Kidney:

Length: 13.2 cm. Echogenicity within normal limits. No mass or
hydronephrosis visualized.

Abdominal aorta:

No aneurysm visualized.

Other findings:

None.
IMPRESSION: Exam detail is diminished due to patient's body habitus.

1. Normal exam.

## 2015-09-17 ENCOUNTER — Ambulatory Visit (INDEPENDENT_AMBULATORY_CARE_PROVIDER_SITE_OTHER): Payer: 59 | Admitting: Family Medicine

## 2015-09-17 ENCOUNTER — Encounter: Payer: Self-pay | Admitting: Family Medicine

## 2015-09-17 VITALS — BP 108/68 | HR 60 | Temp 98.4°F | Ht 68.0 in | Wt 206.4 lb

## 2015-09-17 DIAGNOSIS — J0100 Acute maxillary sinusitis, unspecified: Secondary | ICD-10-CM

## 2015-09-17 MED ORDER — AMOXICILLIN 875 MG PO TABS: 875.0000 mg | ORAL_TABLET | Freq: Two times a day (BID) | ORAL | Status: DC

## 2015-09-17 NOTE — Patient Instructions (Signed)
Schedule your complete physical in 3-4 months Start the Amoxicillin twice daily- take w/ food Drink plenty of fluids REST! Mucinex DM for cough/congestion Call with any questions or concerns If you want to join us at the new [place] office, any scheduled appointments will automatically transfer and we will see you at [address] [place], [place], [place] [id number] (OPENING FEB) Happy New Year!!!

## 2015-09-17 NOTE — Progress Notes (Signed)
   Subjective:    Patient ID: Sergio Cline, male    DOB: 10/14/1975, 40 y.o.   MRN: [id number]  HPI URI- sxs started ~1 week ago.  Taking multiple OTC Mucinex meds w/o relief.  Laryngitis x2 days.  + HA, maxillary sinus pain.  No fever.  No N/V.  + sick contacts.  + upper tooth pain.  + ringing in ears.  + PND w/ cough- productive in the AM.  Sputum varies between yellow and green.   Review of Systems For ROS see HPI     Objective:   Physical Exam  Constitutional: He appears well-developed and well-nourished. No distress.  HENT:  Head: Normocephalic and atraumatic.  Right Ear: Tympanic membrane normal.  Left Ear: Tympanic membrane normal.  Nose: Mucosal edema and rhinorrhea present. Right sinus exhibits maxillary sinus tenderness. Right sinus exhibits no frontal sinus tenderness. Left sinus exhibits maxillary sinus tenderness. Left sinus exhibits no frontal sinus tenderness.  Mouth/Throat: Mucous membranes are normal. Oropharyngeal exudate and posterior oropharyngeal erythema present. No posterior oropharyngeal edema.  + PND  Eyes: Conjunctivae and EOM are normal. Pupils are equal, round, and reactive to light.  Neck: Normal range of motion. Neck supple.  Cardiovascular: Normal rate, regular rhythm and normal heart sounds.   Pulmonary/Chest: Effort normal and breath sounds normal. No respiratory distress. He has no wheezes.  Lymphadenopathy:    He has no cervical adenopathy.  Skin: Skin is warm and dry.  Vitals reviewed.         Assessment & Plan:

## 2015-09-17 NOTE — Progress Notes (Signed)
Pre visit review using our clinic review tool, if applicable. No additional management support is needed unless otherwise documented below in the visit note. 

## 2015-09-17 NOTE — Assessment & Plan Note (Signed)
Pt's sxs and PE consistent w/ infxn.  Start abx.  Cough meds prn.  Reviewed supportive care and red flags that should prompt return.  Pt expressed understanding and is in agreement w/ plan.  

## 2015-10-16 ENCOUNTER — Telehealth: Payer: Self-pay | Admitting: Family Medicine

## 2015-10-16 NOTE — Telephone Encounter (Signed)
LM for pt to call and schedule flu shot or update records. °

## 2016-01-22 ENCOUNTER — Encounter: Payer: 59 | Admitting: Family Medicine

## 2016-03-26 ENCOUNTER — Encounter: Payer: 59 | Admitting: Family Medicine

## 2016-06-18 ENCOUNTER — Encounter: Payer: 59 | Admitting: Family Medicine

## 2016-08-17 ENCOUNTER — Encounter (HOSPITAL_COMMUNITY): Payer: Self-pay

## 2016-10-15 ENCOUNTER — Encounter: Payer: 59 | Admitting: Family Medicine

## 2017-02-18 ENCOUNTER — Encounter: Payer: 59 | Admitting: Family Medicine

## 2017-06-03 ENCOUNTER — Encounter: Payer: 59 | Admitting: Family Medicine

## 2017-06-08 ENCOUNTER — Encounter: Payer: 59 | Admitting: Family Medicine

## 2017-08-18 ENCOUNTER — Encounter (HOSPITAL_COMMUNITY): Payer: Self-pay

## 2017-09-16 ENCOUNTER — Encounter: Payer: 59 | Admitting: Family Medicine

## 2017-09-16 DIAGNOSIS — Z0289 Encounter for other administrative examinations: Secondary | ICD-10-CM

## 2017-10-12 DIAGNOSIS — R05 Cough: Secondary | ICD-10-CM | POA: Diagnosis not present

## 2017-10-12 DIAGNOSIS — J0100 Acute maxillary sinusitis, unspecified: Secondary | ICD-10-CM | POA: Diagnosis not present

## 2017-11-15 DIAGNOSIS — J0140 Acute pansinusitis, unspecified: Secondary | ICD-10-CM | POA: Diagnosis not present

## 2017-11-15 DIAGNOSIS — J101 Influenza due to other identified influenza virus with other respiratory manifestations: Secondary | ICD-10-CM | POA: Diagnosis not present

## 2017-11-15 DIAGNOSIS — R509 Fever, unspecified: Secondary | ICD-10-CM | POA: Diagnosis not present

## 2018-01-31 ENCOUNTER — Encounter: Payer: Self-pay | Admitting: General Practice

## 2018-06-12 DIAGNOSIS — Z3141 Encounter for fertility testing: Secondary | ICD-10-CM | POA: Diagnosis not present

## 2018-09-22 ENCOUNTER — Ambulatory Visit (HOSPITAL_COMMUNITY)
Admission: EM | Admit: 2018-09-22 | Discharge: 2018-09-22 | Disposition: A | Discharge status: Home / Self Care | Payer: 59 | Attending: Family Medicine | Admitting: Family Medicine

## 2018-09-22 ENCOUNTER — Encounter (HOSPITAL_COMMUNITY): Payer: Self-pay | Admitting: Family Medicine

## 2018-09-22 DIAGNOSIS — N390 Urinary tract infection, site not specified: Secondary | ICD-10-CM | POA: Insufficient documentation

## 2018-09-22 LAB — POCT URINALYSIS DIP (DEVICE)
Bilirubin Urine: NEGATIVE
Glucose, UA: NEGATIVE mg/dL
KETONES UR: NEGATIVE mg/dL
Nitrite: POSITIVE — AB
PH: 5.5 (ref 5.0–8.0)
PROTEIN: 100 mg/dL — AB
Specific Gravity, Urine: 1.025 (ref 1.005–1.030)
Urobilinogen, UA: 0.2 mg/dL (ref 0.0–1.0)

## 2018-09-22 MED ORDER — CIPROFLOXACIN HCL 500 MG PO TABS: 500.0000 mg | ORAL_TABLET | Freq: Two times a day (BID) | ORAL | 0 refills | Status: AC

## 2018-09-22 NOTE — ED Triage Notes (Signed)
Pt c/o bilateral flank pain since Saturday and now having distended bladder and urinary difficulties

## 2018-09-22 NOTE — ED Provider Notes (Addendum)
[facility]    CSN: [id number] Arrival date & time: 09/22/18  2641     History   Chief Complaint Chief Complaint  Patient presents with  . Flank Pain    HPI Sergio Cline is a 43 y.o. male.   43 yo initial visit patient with back pain, suprapubic pain, foul smelling urine for a week.  No h/o UTI.  He did have fever initially.  Recently travelled to [place].  Works in Data processing manager.   Note:  Problem list includes topics that are no longer problems.     Past Medical History:  Diagnosis Date  . GERD (gastroesophageal reflux disease)    hx of, none recent  . Morbid obesity (HCC)   . Sleep apnea    bipap has not been delivered yet, but will arrive soon    Patient Active Problem List   Diagnosis Date Noted  . Fungal dermatitis 03/18/2014  . Travel advice encounter 03/18/2014  . 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 OSA (obstructive sleep apnea) 09/20/2013  . GERD (gastroesophageal reflux disease) 09/20/2013  . Elevated LDL cholesterol level 09/20/2013  . Paresthesia of hand 09/20/2013  . Allergic rhinitis 05/12/2012  . HEMOPTYSIS UNSPECIFIED 11/13/2010  . Morbid obesity (HCC) 10/01/2010  . PLANTAR FASCIITIS, BILATERAL 09/30/2009    Past Surgical History:  Procedure Laterality Date  . APPENDECTOMY  1990  . ARTHROSCOPIC REPAIR ACL  1997   left knee  . BREATH TEK H PYLORI N/A 10/10/2013   Procedure: BREATH TEK H PYLORI;  Surgeon: Valarie Merino, MD;  Location: [facility];  Service: General;  Laterality: N/A;  . GASTRIC ROUX-EN-Y N/A 01/14/2014   Procedure: LAPAROSCOPIC ROUX-EN-Y GASTRIC BYPASS WITH UPPER ENDOSCOPY;  Surgeon: Valarie Merino, MD;  Location: [facility];  Service: General;  Laterality: N/A;       Home Medications    Prior to Admission medications   Medication Sig Start Date End Date Taking? Authorizing Provider  ciprofloxacin (CIPRO) 500 MG tablet Take 1 tablet (500 mg total) by mouth 2 (two) times daily. 09/22/18    Elvina Sidle, MD    Family History Family History  Problem Relation Age of Onset  . Cancer Maternal Grandmother        ? primary , lung metastases  . Lung cancer Maternal Grandfather        smoker  . Diabetes Neg Hx   . Stroke Neg Hx   . Heart disease Neg Hx     Social History Social History   Tobacco Use  . Smoking status: Never Smoker  . Smokeless tobacco: Never Used  Substance Use Topics  . Alcohol use: No    Comment:  prev 2 /week; none since 08/2013  . Drug use: No     Allergies   Patient has no known allergies.   Review of Systems Review of Systems   Physical Exam Triage Vital Signs ED Triage Vitals [09/22/18 0818]  Enc Vitals Group     BP 110/76     Pulse Rate 73     Resp 18     Temp 98.1 F (36.7 C)     Temp Source Oral     SpO2 95 %     Weight      Height      Head Circumference      Peak Flow      Pain Score      Pain Loc      Pain Edu?  Excl. in GC?    No data found.  Updated Vital Signs BP 110/76 (BP Location: Right Arm)   Pulse 73   Temp 98.1 F (36.7 C) (Oral)   Resp 18   SpO2 95%    Physical Exam Vitals signs and nursing note reviewed.  Constitutional:      General: He is not in acute distress.    Appearance: Normal appearance. He is obese. He is not ill-appearing or toxic-appearing.  HENT:     Head: Normocephalic and atraumatic.     Right Ear: External ear normal.     Left Ear: External ear normal.     Nose: Nose normal.     Mouth/Throat:     Mouth: Mucous membranes are moist.  Eyes:     Conjunctiva/sclera: Conjunctivae normal.  Neck:     Musculoskeletal: Normal range of motion and neck supple.  Pulmonary:     Effort: Pulmonary effort is normal.  Abdominal:     General: There is no distension.     Tenderness: There is abdominal tenderness.     Comments: Mild diffuse suprapubic tenderness with deep palpation  Musculoskeletal: Normal range of motion.  Skin:    General: Skin is warm and dry.    Neurological:     General: No focal deficit present.     Mental Status: He is alert and oriented to person, place, and time.  Psychiatric:        Mood and Affect: Mood normal.      UC Treatments / Results  Labs (all labs ordered are listed, but only abnormal results are displayed) Labs Reviewed  URINE CULTURE - Abnormal; Notable for the following components:      Result Value   Culture >=100,000 COLONIES/mL ESCHERICHIA COLI (*)    Organism ID, Bacteria ESCHERICHIA COLI (*)    All other components within normal limits  POCT URINALYSIS DIP (DEVICE) - Abnormal; Notable for the following components:   Hgb urine dipstick MODERATE (*)    Protein, ur 100 (*)    Nitrite POSITIVE (*)    Leukocytes, UA SMALL (*)    All other components within normal limits    EKG None  Radiology No results found.  Procedures Procedures (including critical care time)  Medications Ordered in UC Medications - No data to display  Initial Impression / Assessment and Plan / UC Course  I have reviewed the triage vital signs and the nursing notes.  Pertinent labs & imaging results that were available during my care of the patient were reviewed by me and considered in my medical decision making (see chart for details).    Final Clinical Impressions(s) / UC Diagnoses   Final diagnoses:  Lower urinary tract infectious disease     Discharge Instructions     You should have your urine checked in 2 weeks once you have finished the antibiotics to make sure the infection is completely clear.    ED Prescriptions    Medication Sig Dispense Auth. Provider   ciprofloxacin (CIPRO) 500 MG tablet Take 1 tablet (500 mg total) by mouth 2 (two) times daily. 20 tablet Elvina Sidle, MD     Controlled Substance Prescriptions [place] Controlled Substance Registry consulted? Not Applicable   Elvina Sidle, MD 09/22/18 [id number]    Elvina Sidle, MD 09/27/18 1231

## 2018-09-22 NOTE — Discharge Instructions (Addendum)
You should have your urine checked in 2 weeks once you have finished the antibiotics to make sure the infection is completely clear.

## 2018-09-24 LAB — URINE CULTURE: Culture: >=100000 — AB

## 2018-09-25 ENCOUNTER — Telehealth (HOSPITAL_COMMUNITY): Payer: Self-pay | Admitting: Emergency Medicine

## 2018-09-25 NOTE — Telephone Encounter (Signed)
Urine culture was positive for Escherichia coli and was given cipro  at urgent care visit. Pt contacted and made aware, educated on completing antibiotic and to follow up if symptoms are persistent. Verbalized understanding. LVMM

## 2018-09-27 ENCOUNTER — Telehealth (HOSPITAL_COMMUNITY): Payer: Self-pay | Admitting: Emergency Medicine

## 2018-09-27 NOTE — Telephone Encounter (Signed)
LVMM for pt.
